# Patient Record
Sex: Female | Born: 1965
Health system: Southern US, Community
[De-identification: ages and names within clinical notes are randomized; demographics above are authoritative.]

## PROBLEM LIST (undated history)

## (undated) DIAGNOSIS — K824 Cholesterolosis of gallbladder: Secondary | ICD-10-CM

## (undated) DIAGNOSIS — E785 Hyperlipidemia, unspecified: Secondary | ICD-10-CM

## (undated) DIAGNOSIS — N390 Urinary tract infection, site not specified: Secondary | ICD-10-CM

## (undated) DIAGNOSIS — Z720 Tobacco use: Secondary | ICD-10-CM

## (undated) DIAGNOSIS — Z794 Long term (current) use of insulin: Secondary | ICD-10-CM

## (undated) DIAGNOSIS — I10 Essential (primary) hypertension: Secondary | ICD-10-CM

## (undated) DIAGNOSIS — F419 Anxiety disorder, unspecified: Secondary | ICD-10-CM

## (undated) DIAGNOSIS — L02421 Furuncle of right axilla: Secondary | ICD-10-CM

## (undated) DIAGNOSIS — G4700 Insomnia, unspecified: Secondary | ICD-10-CM

## (undated) DIAGNOSIS — F329 Major depressive disorder, single episode, unspecified: Secondary | ICD-10-CM

## (undated) DIAGNOSIS — R768 Other specified abnormal immunological findings in serum: Secondary | ICD-10-CM

## (undated) DIAGNOSIS — R319 Hematuria, unspecified: Secondary | ICD-10-CM

## (undated) DIAGNOSIS — I2510 Atherosclerotic heart disease of native coronary artery without angina pectoris: Secondary | ICD-10-CM

## (undated) DIAGNOSIS — F32A Depression, unspecified: Secondary | ICD-10-CM

## (undated) DIAGNOSIS — E109 Type 1 diabetes mellitus without complications: Secondary | ICD-10-CM

## (undated) HISTORY — PX: APPENDECTOMY: SHX54

## (undated) HISTORY — DX: Hematuria, unspecified: R31.9

## (undated) HISTORY — PX: TONSILECTOMY/ADENOIDECTOMY WITH MYRINGOTOMY: SHX6125

## (undated) HISTORY — DX: Cholesterolosis of gallbladder: K82.4

## (undated) HISTORY — DX: Type 1 diabetes mellitus without complications: E10.9

## (undated) HISTORY — DX: Essential (primary) hypertension: I10

## (undated) HISTORY — DX: Atherosclerotic heart disease of native coronary artery without angina pectoris: I25.10

## (undated) HISTORY — DX: Long term (current) use of insulin: Z79.4

## (undated) HISTORY — PX: CARDIAC CATHETERIZATION: SHX172

## (undated) HISTORY — DX: Hyperlipidemia, unspecified: E78.5

## (undated) HISTORY — PX: NASAL SINUS SURGERY: SHX719

## (undated) HISTORY — DX: Anxiety disorder, unspecified: F41.9

## (undated) HISTORY — DX: Other specified abnormal immunological findings in serum: R76.8

## (undated) HISTORY — DX: Furuncle of right axilla: L02.421

## (undated) HISTORY — DX: Urinary tract infection, site not specified: N39.0

## (undated) HISTORY — DX: Major depressive disorder, single episode, unspecified: F32.9

## (undated) HISTORY — DX: Insomnia, unspecified: G47.00

## (undated) HISTORY — DX: Depression, unspecified: F32.A

## (undated) HISTORY — DX: Tobacco use: Z72.0

---

## 2010-05-30 ENCOUNTER — Emergency Department: Payer: Self-pay | Admitting: Emergency Medicine

## 2011-12-07 ENCOUNTER — Emergency Department: Payer: Self-pay | Admitting: Emergency Medicine

## 2012-03-19 ENCOUNTER — Ambulatory Visit: Payer: Self-pay | Admitting: Cardiology

## 2013-09-16 ENCOUNTER — Ambulatory Visit: Payer: Self-pay | Admitting: Surgery

## 2013-09-20 ENCOUNTER — Emergency Department: Payer: Self-pay | Admitting: Emergency Medicine

## 2013-09-25 ENCOUNTER — Ambulatory Visit: Payer: Self-pay | Admitting: Surgery

## 2013-11-13 DIAGNOSIS — R768 Other specified abnormal immunological findings in serum: Secondary | ICD-10-CM

## 2013-11-13 HISTORY — DX: Other specified abnormal immunological findings in serum: R76.8

## 2014-03-23 ENCOUNTER — Ambulatory Visit: Payer: Self-pay | Admitting: General Surgery

## 2014-04-14 ENCOUNTER — Encounter: Payer: Self-pay | Admitting: *Deleted

## 2014-04-14 DIAGNOSIS — K824 Cholesterolosis of gallbladder: Secondary | ICD-10-CM

## 2014-04-14 HISTORY — DX: Cholesterolosis of gallbladder: K82.4

## 2014-05-16 DIAGNOSIS — G4700 Insomnia, unspecified: Secondary | ICD-10-CM | POA: Insufficient documentation

## 2014-05-16 DIAGNOSIS — I2510 Atherosclerotic heart disease of native coronary artery without angina pectoris: Secondary | ICD-10-CM | POA: Insufficient documentation

## 2014-06-08 DIAGNOSIS — Z720 Tobacco use: Secondary | ICD-10-CM | POA: Insufficient documentation

## 2014-06-08 DIAGNOSIS — Z794 Long term (current) use of insulin: Secondary | ICD-10-CM | POA: Insufficient documentation

## 2014-07-16 ENCOUNTER — Other Ambulatory Visit: Payer: Self-pay | Admitting: Family Medicine

## 2014-07-16 DIAGNOSIS — K824 Cholesterolosis of gallbladder: Secondary | ICD-10-CM | POA: Insufficient documentation

## 2014-07-22 ENCOUNTER — Ambulatory Visit: Admission: RE | Admit: 2014-07-22 | Payer: Self-pay | Source: Ambulatory Visit

## 2014-07-27 ENCOUNTER — Ambulatory Visit
Admission: RE | Admit: 2014-07-27 | Discharge: 2014-07-27 | Disposition: A | Discharge status: Home / Self Care | Payer: BLUE CROSS/BLUE SHIELD | Source: Ambulatory Visit | Attending: Family Medicine | Admitting: Family Medicine

## 2014-07-27 ENCOUNTER — Encounter: Payer: Self-pay | Admitting: Family Medicine

## 2014-07-27 DIAGNOSIS — K824 Cholesterolosis of gallbladder: Secondary | ICD-10-CM | POA: Diagnosis not present

## 2014-08-25 ENCOUNTER — Encounter: Payer: Self-pay | Admitting: *Deleted

## 2014-08-25 ENCOUNTER — Inpatient Hospital Stay
Admission: EM | Admit: 2014-08-25 | Discharge: 2014-08-27 | DRG: 395 | Disposition: A | Discharge status: Home / Self Care | Payer: BLUE CROSS/BLUE SHIELD | Attending: Internal Medicine | Admitting: Internal Medicine

## 2014-08-25 ENCOUNTER — Emergency Department: Payer: BLUE CROSS/BLUE SHIELD

## 2014-08-25 DIAGNOSIS — K529 Noninfective gastroenteritis and colitis, unspecified: Secondary | ICD-10-CM | POA: Diagnosis not present

## 2014-08-25 DIAGNOSIS — K922 Gastrointestinal hemorrhage, unspecified: Secondary | ICD-10-CM | POA: Diagnosis present

## 2014-08-25 DIAGNOSIS — G4700 Insomnia, unspecified: Secondary | ICD-10-CM | POA: Diagnosis present

## 2014-08-25 DIAGNOSIS — E10649 Type 1 diabetes mellitus with hypoglycemia without coma: Secondary | ICD-10-CM | POA: Diagnosis present

## 2014-08-25 DIAGNOSIS — Z803 Family history of malignant neoplasm of breast: Secondary | ICD-10-CM | POA: Diagnosis not present

## 2014-08-25 DIAGNOSIS — E785 Hyperlipidemia, unspecified: Secondary | ICD-10-CM | POA: Diagnosis present

## 2014-08-25 DIAGNOSIS — Z882 Allergy status to sulfonamides status: Secondary | ICD-10-CM

## 2014-08-25 DIAGNOSIS — K550 Acute vascular disorders of intestine: Principal | ICD-10-CM | POA: Diagnosis present

## 2014-08-25 DIAGNOSIS — E109 Type 1 diabetes mellitus without complications: Secondary | ICD-10-CM | POA: Diagnosis present

## 2014-08-25 DIAGNOSIS — Z794 Long term (current) use of insulin: Secondary | ICD-10-CM

## 2014-08-25 DIAGNOSIS — I2510 Atherosclerotic heart disease of native coronary artery without angina pectoris: Secondary | ICD-10-CM | POA: Diagnosis present

## 2014-08-25 DIAGNOSIS — Z833 Family history of diabetes mellitus: Secondary | ICD-10-CM

## 2014-08-25 DIAGNOSIS — E11649 Type 2 diabetes mellitus with hypoglycemia without coma: Secondary | ICD-10-CM

## 2014-08-25 DIAGNOSIS — F17210 Nicotine dependence, cigarettes, uncomplicated: Secondary | ICD-10-CM | POA: Diagnosis present

## 2014-08-25 DIAGNOSIS — Z885 Allergy status to narcotic agent status: Secondary | ICD-10-CM

## 2014-08-25 DIAGNOSIS — K219 Gastro-esophageal reflux disease without esophagitis: Secondary | ICD-10-CM | POA: Diagnosis present

## 2014-08-25 DIAGNOSIS — F419 Anxiety disorder, unspecified: Secondary | ICD-10-CM | POA: Diagnosis present

## 2014-08-25 DIAGNOSIS — Z9049 Acquired absence of other specified parts of digestive tract: Secondary | ICD-10-CM | POA: Diagnosis present

## 2014-08-25 DIAGNOSIS — Z9889 Other specified postprocedural states: Secondary | ICD-10-CM | POA: Diagnosis not present

## 2014-08-25 DIAGNOSIS — Z9641 Presence of insulin pump (external) (internal): Secondary | ICD-10-CM | POA: Diagnosis present

## 2014-08-25 DIAGNOSIS — F329 Major depressive disorder, single episode, unspecified: Secondary | ICD-10-CM | POA: Diagnosis present

## 2014-08-25 DIAGNOSIS — Z79899 Other long term (current) drug therapy: Secondary | ICD-10-CM

## 2014-08-25 DIAGNOSIS — Z820 Family history of epilepsy and other diseases of the nervous system: Secondary | ICD-10-CM

## 2014-08-25 DIAGNOSIS — Z823 Family history of stroke: Secondary | ICD-10-CM | POA: Diagnosis not present

## 2014-08-25 DIAGNOSIS — I10 Essential (primary) hypertension: Secondary | ICD-10-CM | POA: Diagnosis present

## 2014-08-25 DIAGNOSIS — Z7982 Long term (current) use of aspirin: Secondary | ICD-10-CM

## 2014-08-25 DIAGNOSIS — Z8249 Family history of ischemic heart disease and other diseases of the circulatory system: Secondary | ICD-10-CM | POA: Diagnosis not present

## 2014-08-25 DIAGNOSIS — B9689 Other specified bacterial agents as the cause of diseases classified elsewhere: Secondary | ICD-10-CM | POA: Diagnosis present

## 2014-08-25 DIAGNOSIS — D72829 Elevated white blood cell count, unspecified: Secondary | ICD-10-CM

## 2014-08-25 DIAGNOSIS — A045 Campylobacter enteritis: Secondary | ICD-10-CM

## 2014-08-25 DIAGNOSIS — Z888 Allergy status to other drugs, medicaments and biological substances status: Secondary | ICD-10-CM | POA: Diagnosis not present

## 2014-08-25 DIAGNOSIS — R109 Unspecified abdominal pain: Secondary | ICD-10-CM

## 2014-08-25 DIAGNOSIS — K55059 Acute (reversible) ischemia of intestine, part and extent unspecified: Secondary | ICD-10-CM

## 2014-08-25 LAB — CBC WITH DIFFERENTIAL/PLATELET
Basophils Absolute: 0 10*3/uL (ref 0–0.1)
Basophils Relative: 0 %
EOS ABS: 0 10*3/uL (ref 0–0.7)
EOS PCT: 0 %
HEMATOCRIT: 43.8 % (ref 35.0–47.0)
Hemoglobin: 14.4 g/dL (ref 12.0–16.0)
Lymphocytes Relative: 9 %
Lymphs Abs: 1.6 10*3/uL (ref 1.0–3.6)
MCH: 29 pg (ref 26.0–34.0)
MCHC: 32.8 g/dL (ref 32.0–36.0)
MCV: 88.3 fL (ref 80.0–100.0)
MONOS PCT: 4 %
Monocytes Absolute: 0.8 10*3/uL (ref 0.2–0.9)
Neutro Abs: 16 10*3/uL — ABNORMAL HIGH (ref 1.4–6.5)
Neutrophils Relative %: 87 %
Platelets: 216 10*3/uL (ref 150–440)
RBC: 4.96 MIL/uL (ref 3.80–5.20)
RDW: 14.5 % (ref 11.5–14.5)
WBC: 18.5 10*3/uL — AB (ref 3.6–11.0)

## 2014-08-25 LAB — COMPREHENSIVE METABOLIC PANEL
ALBUMIN: 4 g/dL (ref 3.5–5.0)
ALT: 21 U/L (ref 14–54)
ANION GAP: 9 (ref 5–15)
AST: 23 U/L (ref 15–41)
Alkaline Phosphatase: 76 U/L (ref 38–126)
BUN: 10 mg/dL (ref 6–20)
CO2: 22 mmol/L (ref 22–32)
CREATININE: 0.69 mg/dL (ref 0.44–1.00)
Calcium: 9.2 mg/dL (ref 8.9–10.3)
Chloride: 105 mmol/L (ref 101–111)
GFR calc Af Amer: >60 mL/min (ref >60)
GFR calc non Af Amer: >60 mL/min (ref >60)
Glucose, Bld: 169 mg/dL — ABNORMAL HIGH (ref 65–99)
Potassium: 4 mmol/L (ref 3.5–5.1)
Sodium: 136 mmol/L (ref 135–145)
Total Bilirubin: 0.3 mg/dL (ref 0.3–1.2)
Total Protein: 7.4 g/dL (ref 6.5–8.1)

## 2014-08-25 LAB — PROTIME-INR
INR: 0.93
Prothrombin Time: 12.7 seconds (ref 11.4–15.0)

## 2014-08-25 LAB — URINALYSIS COMPLETE WITH MICROSCOPIC (ARMC ONLY)
BACTERIA UA: NONE SEEN
Bilirubin Urine: NEGATIVE
Glucose, UA: NEGATIVE mg/dL
Leukocytes, UA: NEGATIVE
Nitrite: NEGATIVE
PROTEIN: NEGATIVE mg/dL
Specific Gravity, Urine: 1.02 (ref 1.005–1.030)
pH: 5 (ref 5.0–8.0)

## 2014-08-25 LAB — LIPASE, BLOOD: Lipase: 54 U/L — ABNORMAL HIGH (ref 22–51)

## 2014-08-25 MED ORDER — IOHEXOL 240 MG/ML SOLN
25.0000 mL | Freq: Once | INTRAMUSCULAR | Status: AC | PRN
  Administered 2014-08-25: 25 mL via ORAL

## 2014-08-25 MED ORDER — LISINOPRIL 10 MG PO TABS
10.0000 mg | ORAL_TABLET | Freq: Every day | ORAL | Status: DC
  Administered 2014-08-26 – 2014-08-27 (×2): 10 mg via ORAL
  Filled 2014-08-25 (×2): qty 1

## 2014-08-25 MED ORDER — ACETAMINOPHEN 650 MG RE SUPP
650.0000 mg | Freq: Four times a day (QID) | RECTAL | Status: DC | PRN
Start: 2014-08-25 — End: 2014-08-27

## 2014-08-25 MED ORDER — PROMETHAZINE HCL 25 MG/ML IJ SOLN
25.0000 mg | Freq: Once | INTRAMUSCULAR | Status: AC
  Administered 2014-08-25: 25 mg via INTRAVENOUS
  Filled 2014-08-25: qty 1

## 2014-08-25 MED ORDER — METOPROLOL SUCCINATE ER 25 MG PO TB24
25.0000 mg | ORAL_TABLET | Freq: Every day | ORAL | Status: DC
  Administered 2014-08-26 – 2014-08-27 (×2): 25 mg via ORAL
  Filled 2014-08-25 (×2): qty 1

## 2014-08-25 MED ORDER — ROSUVASTATIN CALCIUM 10 MG PO TABS
10.0000 mg | ORAL_TABLET | Freq: Every day | ORAL | Status: DC
  Administered 2014-08-26 – 2014-08-27 (×2): 10 mg via ORAL
  Filled 2014-08-25 (×2): qty 1

## 2014-08-25 MED ORDER — ACETAMINOPHEN 325 MG PO TABS: 650.0000 mg | ORAL_TABLET | Freq: Four times a day (QID) | ORAL | Status: DC | PRN

## 2014-08-25 MED ORDER — CIPROFLOXACIN IN D5W 400 MG/200ML IV SOLN
400.0000 mg | Freq: Once | INTRAVENOUS | Status: AC
  Administered 2014-08-25: 400 mg via INTRAVENOUS
  Filled 2014-08-25: qty 200

## 2014-08-25 MED ORDER — CIPROFLOXACIN IN D5W 400 MG/200ML IV SOLN
400.0000 mg | Freq: Two times a day (BID) | INTRAVENOUS | Status: DC
  Administered 2014-08-26 – 2014-08-27 (×3): 400 mg via INTRAVENOUS
  Filled 2014-08-25 (×5): qty 200

## 2014-08-25 MED ORDER — ASPIRIN EC 81 MG PO TBEC
81.0000 mg | DELAYED_RELEASE_TABLET | Freq: Every day | ORAL | Status: DC
  Filled 2014-08-25: qty 1

## 2014-08-25 MED ORDER — INSULIN ASPART 100 UNIT/ML ~~LOC~~ SOLN: 0.0000 [IU] | Freq: Four times a day (QID) | SUBCUTANEOUS | Status: DC

## 2014-08-25 MED ORDER — ONDANSETRON HCL 4 MG PO TABS: 4.0000 mg | ORAL_TABLET | Freq: Four times a day (QID) | ORAL | Status: DC | PRN

## 2014-08-25 MED ORDER — IOHEXOL 350 MG/ML SOLN
100.0000 mL | Freq: Once | INTRAVENOUS | Status: AC | PRN
  Administered 2014-08-25: 100 mL via INTRAVENOUS

## 2014-08-25 MED ORDER — METRONIDAZOLE IN NACL 5-0.79 MG/ML-% IV SOLN
500.0000 mg | Freq: Once | INTRAVENOUS | Status: AC
  Administered 2014-08-25: 500 mg via INTRAVENOUS
  Filled 2014-08-25: qty 100

## 2014-08-25 MED ORDER — NICOTINE 10 MG IN INHA
1.0000 | RESPIRATORY_TRACT | Status: DC | PRN
  Filled 2014-08-25: qty 36

## 2014-08-25 MED ORDER — BUSPIRONE HCL 10 MG PO TABS
15.0000 mg | ORAL_TABLET | Freq: Three times a day (TID) | ORAL | Status: DC
  Filled 2014-08-25: qty 2
  Filled 2014-08-25 (×2): qty 1
  Filled 2014-08-25: qty 2

## 2014-08-25 MED ORDER — METRONIDAZOLE IN NACL 5-0.79 MG/ML-% IV SOLN
500.0000 mg | Freq: Three times a day (TID) | INTRAVENOUS | Status: DC
  Administered 2014-08-26 – 2014-08-27 (×5): 500 mg via INTRAVENOUS
  Filled 2014-08-25 (×8): qty 100

## 2014-08-25 MED ORDER — PANTOPRAZOLE SODIUM 40 MG IV SOLR
40.0000 mg | Freq: Two times a day (BID) | INTRAVENOUS | Status: DC
  Administered 2014-08-25 – 2014-08-27 (×4): 40 mg via INTRAVENOUS
  Filled 2014-08-25 (×4): qty 40

## 2014-08-25 MED ORDER — SODIUM CHLORIDE 0.9 % IV BOLUS (SEPSIS)
1000.0000 mL | Freq: Once | INTRAVENOUS | Status: AC
  Administered 2014-08-25: 1000 mL via INTRAVENOUS

## 2014-08-25 MED ORDER — ONDANSETRON HCL 4 MG/2ML IJ SOLN
4.0000 mg | Freq: Four times a day (QID) | INTRAMUSCULAR | Status: DC | PRN
  Administered 2014-08-25: 4 mg via INTRAVENOUS
  Filled 2014-08-25: qty 2

## 2014-08-25 MED ORDER — SODIUM CHLORIDE 0.9 % IV SOLN
Freq: Once | INTRAVENOUS | Status: AC
  Administered 2014-08-25: 21:00:00 via INTRAVENOUS

## 2014-08-25 MED ORDER — CLONAZEPAM 0.5 MG PO TABS: 0.5000 mg | ORAL_TABLET | Freq: Every day | ORAL | Status: DC | PRN

## 2014-08-25 NOTE — H&P (Signed)
[facility] [place] [facility]   PATIENT NAME: Brittany Zavala    MR#:  [id number]  DATE OF BIRTH:  [birth year]  DATE OF ADMISSION:  08/25/2014  PRIMARY CARE PHYSICIAN: Vonita Moss, MD   REQUESTING/REFERRING PHYSICIAN: Paduchowski  CHIEF COMPLAINT:   Chief Complaint  Patient presents with  . Diarrhea    HISTORY OF PRESENT ILLNESS:  Brittany Zavala  is a 49 y.o. female who presents with 1 day of abdominal pain, nausea, diarrhea, and bleeding per rectum. She states that she started having abdominal pain and chills around lunchtime today, and then she started developing diarrhea which turned bloody bowel movements. She's had about 4 episodes of bleeding per rectum, with the last visit seem like a straight blood. On evaluation in the ED she is hemodynamically stable, with stable hemoglobin. CT abdomen and pelvis was negative, but she does have an elevated white blood cell count. She was covered with Cipro and Flagyl for possible colitis, and hospitalists were called for admission for GI bleed. Of note, patient is a type I diabetic and uses an insulin pump at home.  PAST MEDICAL HISTORY:   Past Medical History  Diagnosis Date  . Hyperlipidemia   . Hypertension   . Depression   . Anxiety   . Insomnia   . CAD (coronary artery disease)   . Chronic UTI   . Gallbladder polyp March 2016    needs f/u scan in Sept 2016  . Type 1 diabetes mellitus   . Hematuria   . Insomnia   . Tobacco abuse   . Gall bladder polyp 3/16    On UA- needs follow up scan 6 months later, (9/16)  . Furuncle of right axilla   . Current use of insulin   . ANA positive Oct 2015    1:160 speckled    PAST SURGICAL HISTORY:   Past Surgical History  Procedure Laterality Date  . Cesarean section    . Tonsilectomy/adenoidectomy with myringotomy    . Nasal sinus surgery    . Appendectomy    . Cesarean section      x2    SOCIAL HISTORY:   History  Substance Use Topics   . Smoking status: Current Every Day Smoker    Types: Cigarettes  . Smokeless tobacco: Never Used  . Alcohol Use: No    FAMILY HISTORY:   Family History  Problem Relation Age of Onset  . Cancer Mother     breast  . Stroke Mother   . Dementia Mother   . Addison's disease Mother   . Diabetes Mother   . Heart disease Mother   . Heart disease Father   . Cancer Sister     breast    DRUG ALLERGIES:   Allergies  Allergen Reactions  . Morphine And Related   . Sulfa Antibiotics Hives  . Vicodin [Hydrocodone-Acetaminophen] Nausea And Vomiting  . Septra [Sulfamethoxazole-Trimethoprim] Rash    MEDICATIONS AT HOME:   Prior to Admission medications   Medication Sig Start Date End Date Taking? Authorizing Provider  aspirin EC 81 MG tablet Take 81 mg by mouth daily.    Historical Provider, MD  BD PEN NEEDLE NANO U/F 32G X 4 MM MISC  03/06/14   Historical Provider, MD  busPIRone (BUSPAR) 15 MG tablet Take 15 mg by mouth 3 (three) times daily.    Historical Provider, MD  clonazePAM (KLONOPIN) 0.5 MG tablet Take 0.5 mg by mouth daily as needed for anxiety.  Kerman PasseyMelinda P Lada, MD  CRESTOR 10 MG tablet Take 10 mg by mouth daily.  03/06/14   Historical Provider, MD  fluconazole (DIFLUCAN) 150 MG tablet Take 150 mg by mouth daily as needed.    Kerman PasseyMelinda P Lada, MD  glucagon 1 MG injection Inject 1 mg into the vein once as needed.    Historical Provider, MD  HUMALOG KWIKPEN 100 UNIT/ML KiwkPen Inject 10-20 Units into the skin 3 (three) times daily.  03/06/14   Kerman PasseyMelinda P Lada, MD  hydrOXYzine (VISTARIL) 25 MG capsule Take 25 mg by mouth at bedtime as needed.    Kerman PasseyMelinda P Lada, MD  LANTUS SOLOSTAR 100 UNIT/ML Solostar Pen Inject 60 Units into the skin daily at 10 pm.  03/06/14   Kerman PasseyMelinda P Lada, MD  metoprolol succinate (TOPROL-XL) 25 MG 24 hr tablet Take 25 mg by mouth daily.  03/06/14   Kerman PasseyMelinda P Lada, MD  nicotine (NICOTROL) 10 MG inhaler Inhale 1 continuous puffing into the lungs as needed for  smoking cessation.    Historical Provider, MD  omeprazole (PRILOSEC) 20 MG capsule Take 20 mg by mouth daily.  03/06/14   Kerman PasseyMelinda P Lada, MD  ONE TOUCH ULTRA TEST test strip  03/06/14   Historical Provider, MD  quinapril (ACCUPRIL) 10 MG tablet Take 10 mg by mouth daily.  03/05/14   Kerman PasseyMelinda P Lada, MD    REVIEW OF SYSTEMS:  Review of Systems  Constitutional: Positive for chills. Negative for fever, weight loss and malaise/fatigue.  HENT: Negative for ear pain, hearing loss and tinnitus.   Eyes: Negative for blurred vision, double vision, pain and redness.  Respiratory: Negative for cough, hemoptysis and shortness of breath.   Cardiovascular: Negative for chest pain, palpitations, orthopnea and leg swelling.  Gastrointestinal: Positive for nausea, abdominal pain, diarrhea and blood in stool. Negative for vomiting and constipation.  Genitourinary: Negative for dysuria, frequency and hematuria.  Musculoskeletal: Negative for back pain, joint pain and neck pain.  Skin:       No acne, rash, or lesions  Neurological: Negative for dizziness, tremors, focal weakness and weakness.  Endo/Heme/Allergies: Negative for polydipsia. Does not bruise/bleed easily.  Psychiatric/Behavioral: Negative for depression. The patient is not nervous/anxious and does not have insomnia.      VITAL SIGNS:   Filed Vitals:   08/25/14 1930 08/25/14 2003 08/25/14 2030 08/25/14 2100  BP: 165/78 177/84 166/83 161/78  Pulse: 65 90    Temp:      Resp:  18    Height:      Weight:      SpO2: 100%      Wt Readings from Last 3 Encounters:  08/25/14 104.327 kg (230 lb)  06/05/14 103.874 kg (229 lb)  05/15/14 102.967 kg (227 lb)    PHYSICAL EXAMINATION:  Physical Exam  Constitutional: She is oriented to person, place, and time. She appears well-developed and well-nourished. No distress.  HENT:  Head: Normocephalic and atraumatic.  Mouth/Throat: Oropharynx is clear and moist.  Eyes: Conjunctivae and EOM are normal.  Pupils are equal, round, and reactive to light. No scleral icterus.  Neck: Normal range of motion. Neck supple. No JVD present. No thyromegaly present.  Cardiovascular: Normal rate, regular rhythm and intact distal pulses.  Exam reveals no gallop and no friction rub.   No murmur heard. Respiratory: Effort normal and breath sounds normal. No respiratory distress. She has no wheezes. She has no rales.  GI: Soft. Bowel sounds are normal. She exhibits no distension. There  is tenderness (Lower abdominal, greatest in the left lower quadrant).  Musculoskeletal: Normal range of motion. She exhibits no edema.  No arthritis, no gout  Lymphadenopathy:    She has no cervical adenopathy.  Neurological: She is alert and oriented to person, place, and time. No cranial nerve deficit.  No dysarthria, no aphasia  Skin: Skin is warm and dry. No rash noted. No erythema.  Psychiatric: She has a normal mood and affect. Her behavior is normal. Judgment and thought content normal.    LABORATORY PANEL:   CBC  Recent Labs Lab 08/25/14 1700  WBC 18.5*  HGB 14.4  HCT 43.8  PLT 216   ------------------------------------------------------------------------------------------------------------------  Chemistries   Recent Labs Lab 08/25/14 1700  NA 136  K 4.0  CL 105  CO2 22  GLUCOSE 169*  BUN 10  CREATININE 0.69  CALCIUM 9.2  AST 23  ALT 21  ALKPHOS 76  BILITOT 0.3   ------------------------------------------------------------------------------------------------------------------  Cardiac Enzymes No results for input(s): TROPONINI in the last 168 hours. ------------------------------------------------------------------------------------------------------------------  RADIOLOGY:  Ct Abdomen Pelvis W Contrast  08/25/2014   CLINICAL DATA:  Diffuse abdominal pain and blood in stool.  EXAM: CT ABDOMEN AND PELVIS WITH CONTRAST  TECHNIQUE: Multidetector CT imaging of the abdomen and pelvis was  performed using the standard protocol following bolus administration of intravenous contrast.  CONTRAST:  OMNIPAQUE IOHEXOL 350 MG/ML SOLN  COMPARISON:  None.  FINDINGS: Small to moderate-sized hiatal hernia present. The liver, gallbladder, pancreas, spleen, adrenal glands and kidneys are within normal limits.  Bowel shows no evidence of obstruction or inflammation. No focal bowel lesion is identified. There is a focal area of bulky mesenteric calcification in the left lower quadrant measuring approximately 11 mm in diameter lying adjacent to small bowel. This is not causing bowel obstruction and is not associated with a soft tissue mass or surrounding inflammation.  No abnormal fluid collection or abscess is seen. No lymphadenopathy identified. No hernias are seen. The bladder is unremarkable. The uterus appears unremarkable by CT. 1.8 cm cyst of the left ovary is likely physiologic. No bony abnormalities are identified. Calcified plaque is present in the distal aorta bilateral common iliac arteries without evidence of aneurysm. Visualized lung bases are unremarkable.  IMPRESSION: 1. No evidence of bowel lesion or inflammation. 2. Bulky left lower quadrant mesenteric calcification which is likely benign. This abuts small bowel but is not causing any inflammation or obstruction.   Electronically Signed   By: Irish Lack M.D.   On: 08/25/2014 20:04    EKG:  No orders found for this or any previous visit.  IMPRESSION AND PLAN:  Principal Problem:   GI bleed - unclear etiology of this time, seems potentially be a lower origin GI bleed. CT scan of the abdomen and pelvis was negative, surprisingly. Given her elevated white count we'll continue to cover her for colitis, we'll put her on twice a day PPI, continue monitor hemoglobin, and get a GI consult. We will keep her nothing by mouth for now Active Problems:   HTN (hypertension) - stable and even moderately elevated, continue home antihypertensives,  with IV when necessary antihypertensives.   GERD (gastroesophageal reflux disease) - PPI as above   Type 1 diabetes - sliding scale insulin with appropriate every 6 hour fingersticks while nothing by mouth   Anxiety - continue home anxiolytic   HLD (hyperlipidemia) - continue home statin when taking by mouth  All the records are reviewed and case discussed with ED provider.  Management plans discussed with the patient and/or family.  DVT PROPHYLAXIS: mechanical only  ADMISSION STATUS: Inpatient  CODE STATUS: Full  TOTAL TIME TAKING CARE OF THIS PATIENT: 45 minutes.    Gerilyn Stargell FIELDING 08/25/2014, 9:54 PM  [facility]  Office  [phone]  CC: Primary care physician; Vonita Moss, MD

## 2014-08-25 NOTE — ED Provider Notes (Signed)
[facility] Emergency Department Provider Note  Time seen: 6:53 PM  I have reviewed the triage vital signs and the nursing notes.   HISTORY  Chief Complaint Diarrhea    HPI Brittany Zavala is a 49 y.o. female with a past medical history of hypertension, hyperlipidemia, type I diabetic who presents the emergency department with lower abdominal pain and bloody diarrhea. According to the patient around noon today she started feeling lower abdominal cramping type pains. She had several bowel movements which turned watery, and then to blood. She has had approximately 4 bright red blood bowel movements this afternoon/evening. States her lower abdominal pain is left and right lower abdomen, also somewhat in the left upper quadrant as well. Denies any dysuria, has noted darker urine lately. Denies any other bleeding. Patient describes her abdominal cramping as moderate, diarrhea as moderate, has not noticed any modifying factors. Patient does feel nauseated, but has not vomited.     Past Medical History  Diagnosis Date  . Hyperlipidemia   . Hypertension   . Depression   . Anxiety   . Insomnia   . CAD (coronary artery disease)   . Chronic UTI   . Gallbladder polyp March 2016    needs f/u scan in Sept 2016  . Type 1 diabetes mellitus   . Hematuria   . Insomnia   . Tobacco abuse   . Gall bladder polyp 3/16    On UA- needs follow up scan 6 months later, (9/16)  . Furuncle of right axilla   . Current use of insulin   . ANA positive Oct 2015    1:160 speckled    Patient Active Problem List   Diagnosis Date Noted  . Gallbladder polyp 07/16/2014  . Tobacco abuse   . Current use of insulin   . Insomnia   . CAD (coronary artery disease)   . Gallbladder polyp 04/14/2014  . ANA positive 11/13/2013    Past Surgical History  Procedure Laterality Date  . Cesarean section    . Tonsilectomy/adenoidectomy with myringotomy    . Nasal sinus surgery     . Appendectomy    . Cesarean section      x2    Current Outpatient Rx  Name  Route  Sig  Dispense  Refill  . aspirin EC 81 MG tablet   Oral   Take 81 mg by mouth daily.         . BD PEN NEEDLE NANO U/F 32G X 4 MM MISC                 Dispense as written.   . busPIRone (BUSPAR) 15 MG tablet   Oral   Take 15 mg by mouth 3 (three) times daily.         . clonazePAM (KLONOPIN) 0.5 MG tablet   Oral   Take 0.5 mg by mouth daily as needed for anxiety.         . CRESTOR 10 MG tablet   Oral   Take 10 mg by mouth daily.            Dispense as written.   . fluconazole (DIFLUCAN) 150 MG tablet   Oral   Take 150 mg by mouth daily as needed.         Marland Kitchen glucagon 1 MG injection   Intravenous   Inject 1 mg into the vein once as needed.         Marland Kitchen HUMALOG KWIKPEN 100  UNIT/ML KiwkPen   Subcutaneous   Inject 10-20 Units into the skin 3 (three) times daily.            Dispense as written.   . hydrOXYzine (VISTARIL) 25 MG capsule   Oral   Take 25 mg by mouth at bedtime as needed.         Marland Kitchen LANTUS SOLOSTAR 100 UNIT/ML Solostar Pen   Subcutaneous   Inject 60 Units into the skin daily at 10 pm.            Dispense as written.   . metoprolol succinate (TOPROL-XL) 25 MG 24 hr tablet   Oral   Take 25 mg by mouth daily.          . nicotine (NICOTROL) 10 MG inhaler   Inhalation   Inhale 1 continuous puffing into the lungs as needed for smoking cessation.         Marland Kitchen omeprazole (PRILOSEC) 20 MG capsule   Oral   Take 20 mg by mouth daily.          . ONE TOUCH ULTRA TEST test strip                 Dispense as written.   . quinapril (ACCUPRIL) 10 MG tablet   Oral   Take 10 mg by mouth daily.            Allergies Morphine and related; Vicodin; and Septra  Family History  Problem Relation Age of Onset  . Cancer Mother     breast  . Stroke Mother   . Dementia Mother   . Addison's disease Mother   . Diabetes Mother   . Heart disease  Mother   . Heart disease Father   . Cancer Sister     breast    Social History History  Substance Use Topics  . Smoking status: Current Every Day Smoker    Types: Cigarettes  . Smokeless tobacco: Never Used  . Alcohol Use: No    Review of Systems Constitutional: Negative for fever. Cardiovascular: Negative for chest pain. Respiratory: Negative for shortness of breath. Gastrointestinal: Positive for lower and left upper quadrant abdominal pain. Positive for nausea. Negative for vomiting. Positive for bloody diarrhea. Genitourinary: Negative for dysuria. Positive for darker urine. Musculoskeletal: Negative for back pain. 10-point ROS otherwise negative.  ____________________________________________   PHYSICAL EXAM:  VITAL SIGNS: ED Triage Vitals  Enc Vitals Group     BP 08/25/14 1652 127/75 mmHg     Pulse Rate 08/25/14 1652 79     Resp 08/25/14 1652 24     Temp 08/25/14 1652 98.4 F (36.9 C)     Temp src --      SpO2 08/25/14 1652 98 %     Weight 08/25/14 1652 230 lb (104.327 kg)     Height 08/25/14 1652 5\' 8"  (1.727 m)     Head Cir --      Peak Flow --      Pain Score 08/25/14 1652 5     Pain Loc --      Pain Edu? --      Excl. in GC? --     Constitutional: Alert and oriented. Mild distress due to pain. Eyes: Normal exam ENT   Mouth/Throat: Mucous membranes are moist. Cardiovascular: Normal rate, regular rhythm. No murmur Respiratory: Normal respiratory effort without tachypnea nor retractions. Breath sounds are clear and equal bilaterally. No wheezes/rales/rhonchi. Gastrointestinal: Soft, moderate right and left lower abdominal tenderness palpation,  mild left upper quadrant tenderness palpation. No right upper quadrant tenderness palpation. No rebound or guarding. No distention. Musculoskeletal: Nontender with normal range of motion in all extremities.  Neurologic:  Normal speech and language. No gross focal neurologic deficits  Skin:  Skin is warm, dry  and intact.  Psychiatric: Mood and affect are normal. Speech and behavior are normal.  ____________________________________________   RADIOLOGY  CT abdomen and pelvis: No acute abnormalities.  ____________________________________________   INITIAL IMPRESSION / ASSESSMENT AND PLAN / ED COURSE  Pertinent labs & imaging results that were available during my care of the patient were reviewed by me and considered in my medical decision making (see chart for details).  Patient with somewhat diffuse abdominal pain/cramping, now with bloody diarrhea 4 today. Patient's labs have resulted showing an elevated white blood cell count of 18.5. Labs and history most suggestive of a colitis. Her son has a history of colitis and has had several bowel operations. She has no personal history of colitis. Denies any personal history of colitis or diverticulitis, has never had bleeding per rectum previously. Denies any rectal pain. We will obtain a CT scan of her abdomen and pelvis. We will treat with Phenergan and fluids. Patient wishes to hold off on IV pain medication at this time.  CT scan has resulted in largely normal results. Rectal exam shows gross blood, strongly guaiac positive. Given likely lower GI bleed with a white count of 18, and abdominal pain we will cover colitis even with a negative CT scan. We'll admit the patient to the hospital for further evaluation and treatment.  ____________________________________________   FINAL CLINICAL IMPRESSION(S) / ED DIAGNOSES  Lower abdominal pain Lower GI bleed   Minna AntisKevin Ibrohim Simmers, MD 08/25/14 2116

## 2014-08-25 NOTE — ED Notes (Signed)
AAOx3.  Anxious.  Skin warm and dry.  No SOB/DOE.  C/o abdominal pain 4/10.  Ambulated to BR independently. Gait steady, posture relaxed. Continue to monitor.

## 2014-08-25 NOTE — ED Notes (Signed)
Today developed diarrhea with some bright red blood in it, abd cramps, left flank

## 2014-08-26 LAB — BASIC METABOLIC PANEL
ANION GAP: 9 (ref 5–15)
BUN: 6 mg/dL (ref 6–20)
CO2: 22 mmol/L (ref 22–32)
CREATININE: 0.57 mg/dL (ref 0.44–1.00)
Calcium: 8.6 mg/dL — ABNORMAL LOW (ref 8.9–10.3)
Chloride: 107 mmol/L (ref 101–111)
GFR calc Af Amer: >60 mL/min (ref >60)
GFR calc non Af Amer: >60 mL/min (ref >60)
GLUCOSE: 185 mg/dL — AB (ref 65–99)
Potassium: 3.7 mmol/L (ref 3.5–5.1)
Sodium: 138 mmol/L (ref 135–145)

## 2014-08-26 LAB — CBC
HEMATOCRIT: 38.7 % (ref 35.0–47.0)
HEMOGLOBIN: 12.6 g/dL (ref 12.0–16.0)
MCH: 28.8 pg (ref 26.0–34.0)
MCHC: 32.4 g/dL (ref 32.0–36.0)
MCV: 88.8 fL (ref 80.0–100.0)
PLATELETS: 193 10*3/uL (ref 150–440)
RBC: 4.36 MIL/uL (ref 3.80–5.20)
RDW: 14.4 % (ref 11.5–14.5)
WBC: 13.1 10*3/uL — ABNORMAL HIGH (ref 3.6–11.0)

## 2014-08-26 LAB — GLUCOSE, CAPILLARY
Glucose-Capillary: 101 mg/dL — ABNORMAL HIGH (ref 65–99)
Glucose-Capillary: 172 mg/dL — ABNORMAL HIGH (ref 65–99)
Glucose-Capillary: 178 mg/dL — ABNORMAL HIGH (ref 65–99)
Glucose-Capillary: 185 mg/dL — ABNORMAL HIGH (ref 65–99)
Glucose-Capillary: 210 mg/dL — ABNORMAL HIGH (ref 65–99)

## 2014-08-26 LAB — HEMOGLOBIN A1C: Hgb A1c MFr Bld: 7.1 % — ABNORMAL HIGH (ref 4.0–6.0)

## 2014-08-26 LAB — C DIFFICILE QUICK SCREEN W PCR REFLEX
C Diff antigen: NEGATIVE
C Diff interpretation: NEGATIVE
C Diff toxin: NEGATIVE

## 2014-08-26 MED ORDER — POTASSIUM CHLORIDE IN NACL 20-0.9 MEQ/L-% IV SOLN
INTRAVENOUS | Status: DC
  Administered 2014-08-26 – 2014-08-27 (×2): via INTRAVENOUS
  Filled 2014-08-26 (×7): qty 1000

## 2014-08-26 MED ORDER — TRAMADOL HCL 50 MG PO TABS
50.0000 mg | ORAL_TABLET | Freq: Four times a day (QID) | ORAL | Status: DC | PRN
  Administered 2014-08-26 (×2): 50 mg via ORAL
  Filled 2014-08-26 (×2): qty 1

## 2014-08-26 NOTE — Progress Notes (Signed)
Patient states that she is pain.Having abdominal  aching pain. Patient refuses to take PRN pain medication. MD paged awaiting response.

## 2014-08-26 NOTE — Progress Notes (Signed)
Dr. Clent RidgesWalsh returned paged no new orders giving. Nursing will continue to monitor.

## 2014-08-26 NOTE — Progress Notes (Signed)
Patient is alert and oriented. Reporting pain improved to tolerable level with PRN. Up to BR for red, scant BM today x 2. Voiding independently. GI consult ordered. Diet advanced to clear liquids per prime MD. Episode of patient's blood glucose trending downward per patient managed insulin pump. Blood glucose improved after PO juice. Resting quietly between care. Will continue to monitor.

## 2014-08-26 NOTE — Progress Notes (Signed)
[facility] [place] [facility]   PATIENT NAME: Brittany Zavala    MR#:  [id number]  DATE OF BIRTH:  October 01, 1965  SUBJECTIVE:  CHIEF COMPLAINT:   Chief Complaint  Patient presents with  . Diarrhea   the patient presented to the hospital with nausea ,  diarrhea and rectal bleeding as well as chills. In emergency room, she was noted to have marked leukocytosis with white blood cell count 18,000 and a CT scan of the abdomen and pelvis was performed. CT scan revealed left lower quadrant mesenteric calcification which was felt to be benign but otherwise no lesions or inflammation or obstruction. Patient was initiated on ciprofloxacin and Flagyl IV. She denies any more bleeding, although per nursing staff she did have small amount of blood rectally earlier today. White blood cell count is improving with current therapy and hemoglobin remains stable, although slightly lower than on admission. Blood Glucose levels are lower, patient is nothing by mouth so far. Complains of lower abdominal pain and cramping sensation just prior to defecation. Complains of headache and requests nonsteroidal anti-inflammatory medications which she takes intermittently. Refuses Tylenol due to history of problems with liver is as well as interference with "blood glucose readings":  Review of Systems  Constitutional: Negative for fever, chills and weight loss.  HENT: Negative for congestion.   Eyes: Negative for blurred vision and double vision.  Respiratory: Negative for cough, sputum production, shortness of breath and wheezing.   Cardiovascular: Negative for chest pain, palpitations, orthopnea, leg swelling and PND.  Gastrointestinal: Positive for nausea, abdominal pain, diarrhea and blood in stool. Negative for vomiting and constipation.  Genitourinary: Negative for dysuria, urgency, frequency and hematuria.  Musculoskeletal: Negative for falls.  Neurological: Negative for dizziness,  tremors, focal weakness and headaches.  Endo/Heme/Allergies: Does not bruise/bleed easily.  Psychiatric/Behavioral: Negative for depression. The patient does not have insomnia.     VITAL SIGNS: Blood pressure 152/75, pulse 96, temperature 98 F (36.7 C), temperature source Oral, resp. rate 16, height 5\' 8"  (1.727 m), weight 102.377 kg (225 lb 11.2 oz), last menstrual period 08/20/2014, SpO2 99 %.  PHYSICAL EXAMINATION:   GENERAL:  49 y.o.-year-old patient lying in the bed with no acute distress. Dry oral mucosa EYES: Pupils equal, round, reactive to light and accommodation. No scleral icterus. Extraocular muscles intact.  HEENT: Head atraumatic, normocephalic. Oropharynx and nasopharynx clear.  NECK:  Supple, no jugular venous distention. No thyroid enlargement, no tenderness.  LUNGS: Normal breath sounds bilaterally, no wheezing, rales,rhonchi or crepitation. No use of accessory muscles of respiration.  CARDIOVASCULAR: S1, S2 normal. No murmurs, rubs, or gallops.  ABDOMEN: Soft, tender in lower abdomen suprapubically. There is no rebound or guarding, nondistended. Bowel sounds present. No organomegaly or mass.  EXTREMITIES: No pedal edema, cyanosis, or clubbing.  NEUROLOGIC: Cranial nerves II through XII are intact. Muscle strength 5/5 in all extremities. Sensation intact. Gait not checked.  PSYCHIATRIC: The patient is alert and oriented x 3.  SKIN: No obvious rash, lesion, or ulcer.   ORDERS/RESULTS REVIEWED:   CBC  Recent Labs Lab 08/25/14 1700 08/26/14 0525  WBC 18.5* 13.1*  HGB 14.4 12.6  HCT 43.8 38.7  PLT 216 193  MCV 88.3 88.8  MCH 29.0 28.8  MCHC 32.8 32.4  RDW 14.5 14.4  LYMPHSABS 1.6  --   MONOABS 0.8  --   EOSABS 0.0  --   BASOSABS 0.0  --    ------------------------------------------------------------------------------------------------------------------  Chemistries   Recent Labs Lab  08/25/14 1700 08/26/14 0525  NA 136 138  K 4.0 3.7  CL 105 107   CO2 22 22  GLUCOSE 169* 185*  BUN 10 6  CREATININE 0.69 0.57  CALCIUM 9.2 8.6*  AST 23  --   ALT 21  --   ALKPHOS 76  --   BILITOT 0.3  --    ------------------------------------------------------------------------------------------------------------------ estimated creatinine clearance is 108.8 mL/min (by C-G formula based on Cr of 0.57). ------------------------------------------------------------------------------------------------------------------ No results for input(s): TSH, T4TOTAL, T3FREE, THYROIDAB in the last 72 hours.  Invalid input(s): FREET3  Cardiac Enzymes No results for input(s): CKMB, TROPONINI, MYOGLOBIN in the last 168 hours.  Invalid input(s): CK ------------------------------------------------------------------------------------------------------------------ Invalid input(s): POCBNP ---------------------------------------------------------------------------------------------------------------  RADIOLOGY: Ct Abdomen Pelvis W Contrast  08/25/2014   CLINICAL DATA:  Diffuse abdominal pain and blood in stool.  EXAM: CT ABDOMEN AND PELVIS WITH CONTRAST  TECHNIQUE: Multidetector CT imaging of the abdomen and pelvis was performed using the standard protocol following bolus administration of intravenous contrast.  CONTRAST:  OMNIPAQUE IOHEXOL 350 MG/ML SOLN  COMPARISON:  None.  FINDINGS: Small to moderate-sized hiatal hernia present. The liver, gallbladder, pancreas, spleen, adrenal glands and kidneys are within normal limits.  Bowel shows no evidence of obstruction or inflammation. No focal bowel lesion is identified. There is a focal area of bulky mesenteric calcification in the left lower quadrant measuring approximately 11 mm in diameter lying adjacent to small bowel. This is not causing bowel obstruction and is not associated with a soft tissue mass or surrounding inflammation.  No abnormal fluid collection or abscess is seen. No lymphadenopathy identified. No  hernias are seen. The bladder is unremarkable. The uterus appears unremarkable by CT. 1.8 cm cyst of the left ovary is likely physiologic. No bony abnormalities are identified. Calcified plaque is present in the distal aorta bilateral common iliac arteries without evidence of aneurysm. Visualized lung bases are unremarkable.  IMPRESSION: 1. No evidence of bowel lesion or inflammation. 2. Bulky left lower quadrant mesenteric calcification which is likely benign. This abuts small bowel but is not causing any inflammation or obstruction.   Electronically Signed   By: Irish Lack M.D.   On: 08/25/2014 20:04    EKG: No orders found for this or any previous visit.  ASSESSMENT AND PLAN:  Principal Problem:   GI bleed Active Problems:   Anxiety   HLD (hyperlipidemia)   HTN (hypertension)   GERD (gastroesophageal reflux disease)   Type 1 diabetes 1. Acute enterocolitis with rectal bleeding, unclear etiology at this time. Questionable inflammatory versus infectious. Continue antibiotic therapy. Get gastroenterology consultation for further recommendations, possibly EGD versus colonoscopy versus both. Continue PPIs twice daily IV 2. Rectal bleeding as above, continue PPI. Get gastroenterology consultation for scope 3. Diabetes mellitus. Type 1. on insulin pump and hypoglycemia at present, since patient is nothing by mouth.  Will start the patient on clear liquid diet, following for any signs of bleeding, continue observing patient's Accu-Cheks closely and initiate patient on D5 solution if remains hypoglycemic 4. Leukocytosis seemed to be improved significantly on antibiotic therapy   Management plans discussed with the patient, family and they are in agreement.   DRUG ALLERGIES:  Allergies  Allergen Reactions  . Morphine And Related   . Sulfa Antibiotics Hives  . Vicodin [Hydrocodone-Acetaminophen] Nausea And Vomiting  . Septra [Sulfamethoxazole-Trimethoprim] Rash    CODE STATUS:      Code Status Orders        Start     Ordered  08/25/14 2313  Full code   Continuous     08/25/14 2312      TOTAL TIME TAKING CARE OF THIS PATIENT: 45 minutes.    Katharina Caper M.D on 08/26/2014 at 12:14 PM  Between 7am to 6pm - Pager - [phone]  After 6pm go to [email or web address] - password [email or web address] [facility]  [facility]  Office  [phone]  CC: Primary care physician; Vonita Moss, MD

## 2014-08-26 NOTE — Consult Note (Signed)
GI Inpatient Consult Note  Reason for Consult: Blood per rectum   Attending Requesting Consult: [place]  History of Present Illness: Brittany MinkConstance Kathleen Zavala is a 49 y.o. female with a history of type I diabetes on an insulin pump, HTN, and CAD admitted for evaluation of abdominal pain, nausea, and bloody diarrhea since this afternoon.  [facility] with acute abdominal pain, nausea, diarrhea, and 4 episodes of BRBPR today.  In the ED, WBCs were elevated at 18,500.  CT scan of abdomen and pelvis was negative.  Patient was started on IV Cipro and Flagyl to cover for possible colitis and admitted for further work-up.  Patient reports she began having abdominal pain and chills yesterday around 12:30pm.  The diarrhea started around this time as well, beginning as loose, watery stools and then becoming bloody as the afternoon went on.  She continues to have abdominal cramping, diarrhea with bright red blood, and nausea.  She states she feels slightly better today, but not markedly improved.  The chills anShe denies any recent travel, outdoor activities, or sick contacts.  She has never experienced blood per rectum previously.  No family history of colon cancer or colon polyps.  Her nephew has crohn's disease.  Hgb and vital remain stable, WBCs have decreased to 13 since admission. Denies fever, vomiting, dysphagia, GERD, and constipation.  Last colonoscopy: Never Last EGD: Never  Past Medical History:  Past Medical History  Diagnosis Date  . Hyperlipidemia   . Hypertension   . Depression   . Anxiety   . Insomnia   . CAD (coronary artery disease)   . Chronic UTI   . Gallbladder polyp March 2016    needs f/u scan in Sept 2016  . Type 1 diabetes mellitus   . Hematuria   . Insomnia   . Tobacco abuse   . Gall bladder polyp 3/16    On UA- needs follow up scan 6 months later, (9/16)  . Furuncle of right axilla   . Current use of insulin   . ANA positive Oct 2015    1:160  speckled    Problem List: Patient Active Problem List   Diagnosis Date Noted  . GI bleed 08/25/2014  . Anxiety 08/25/2014  . HLD (hyperlipidemia) 08/25/2014  . HTN (hypertension) 08/25/2014  . GERD (gastroesophageal reflux disease) 08/25/2014  . Type 1 diabetes 08/25/2014  . Gallbladder polyp 07/16/2014  . Tobacco abuse   . Current use of insulin   . Insomnia   . CAD (coronary artery disease)   . Gallbladder polyp 04/14/2014  . ANA positive 11/13/2013    Past Surgical History: Past Surgical History  Procedure Laterality Date  . Cesarean section    . Tonsilectomy/adenoidectomy with myringotomy    . Nasal sinus surgery    . Appendectomy    . Cesarean section      x2    Allergies: Allergies  Allergen Reactions  . Morphine And Related   . Sulfa Antibiotics Hives  . Vicodin [Hydrocodone-Acetaminophen] Nausea And Vomiting  . Septra [Sulfamethoxazole-Trimethoprim] Rash    Home Medications: Prescriptions prior to admission  Medication Sig Dispense Refill Last Dose  . aspirin EC 81 MG tablet Take 81 mg by mouth daily.   Taking  . BD PEN NEEDLE NANO U/F 32G X 4 MM MISC    Taking  . busPIRone (BUSPAR) 15 MG tablet Take 15 mg by mouth 3 (three) times daily.     . clonazePAM (KLONOPIN) 0.5 MG tablet Take  0.5 mg by mouth daily as needed for anxiety.   Not Taking  . CRESTOR 10 MG tablet Take 10 mg by mouth daily.    Taking  . fluconazole (DIFLUCAN) 150 MG tablet Take 150 mg by mouth daily as needed.   Not Taking  . glucagon 1 MG injection Inject 1 mg into the vein once as needed.     Marland Kitchen HUMALOG KWIKPEN 100 UNIT/ML KiwkPen Inject 10-20 Units into the skin 3 (three) times daily.    Taking  . hydrOXYzine (VISTARIL) 25 MG capsule Take 25 mg by mouth at bedtime as needed.     Marland Kitchen LANTUS SOLOSTAR 100 UNIT/ML Solostar Pen Inject 60 Units into the skin daily at 10 pm.    Taking  . metoprolol succinate (TOPROL-XL) 25 MG 24 hr tablet Take 25 mg by mouth daily.    Taking  . nicotine (NICOTROL)  10 MG inhaler Inhale 1 continuous puffing into the lungs as needed for smoking cessation.     Marland Kitchen omeprazole (PRILOSEC) 20 MG capsule Take 20 mg by mouth daily.    Taking  . ONE TOUCH ULTRA TEST test strip    Taking  . quinapril (ACCUPRIL) 10 MG tablet Take 10 mg by mouth daily.    Taking   Home medication reconciliation was completed with the patient.   Scheduled Inpatient Medications:   . aspirin EC  81 mg Oral Daily  . busPIRone  15 mg Oral TID  . ciprofloxacin  400 mg Intravenous Q12H  . insulin aspart  0-9 Units Subcutaneous Q6H  . lisinopril  10 mg Oral Daily  . metoprolol succinate  25 mg Oral Daily  . metronidazole  500 mg Intravenous Q8H  . pantoprazole (PROTONIX) IV  40 mg Intravenous Q12H  . rosuvastatin  10 mg Oral Daily    Continuous Inpatient Infusions:   . 0.9 % NaCl with KCl 20 mEq / L 125 mL/hr at 08/26/14 1334    PRN Inpatient Medications:  acetaminophen **OR** acetaminophen, clonazePAM, nicotine, ondansetron **OR** ondansetron (ZOFRAN) IV, traMADol  Family History: family history includes Addison's disease in her mother; Cancer in her mother and sister; Dementia in her mother; Diabetes in her mother; Heart disease in her father and mother; Stroke in her mother.   Social History:   reports that she has been smoking Cigarettes.  She has never used smokeless tobacco. She reports that she does not drink alcohol or use illicit drugs.  Review of Systems: Constitutional: Weight is stable, no fever. + chills and headache Eyes: No changes in vision. ENT: No oral lesions, sore throat.  GI: see HPI.  Heme/Lymph: No easy bruising.  CV: No chest pain.  GU: No hematuria.  Integumentary: No rashes.  Neuro: No headaches.  Psych: No depression/anxiety.  Endocrine: No heat/cold intolerance.  Allergic/Immunologic: No urticaria.  Resp: No cough, SOB.  Musculoskeletal: No joint swelling.    Physical Examination: BP 142/76 mmHg  Pulse 92  Temp(Src) 98.3 F (36.8 C)  (Oral)  Resp 16  Ht 5\' 8"  (1.727 m)  Wt 102.377 kg (225 lb 11.2 oz)  BMI 34.33 kg/m2  SpO2 100%  LMP 08/20/2014 Gen: NAD, alert and oriented x 4, resting comfortably in bed HEENT: PEERLA, EOMI, Neck: supple, no JVD or thyromegaly Chest: CTA bilaterally, no wheezes, crackles, or other adventitious sounds CV: RRR, no m/g/c/r Abd: soft, NT, ND, +BS in all four quadrants; no HSM, guarding, ridigity, or rebound tenderness Ext: no edema, well perfused with 2+ pulses, Skin: no rash  or lesions noted Lymph: no LAD  Data: Lab Results  Component Value Date   WBC 13.1* 08/26/2014   HGB 12.6 08/26/2014   HCT 38.7 08/26/2014   MCV 88.8 08/26/2014   PLT 193 08/26/2014    Recent Labs Lab 08/25/14 1700 08/26/14 0525  HGB 14.4 12.6   Lab Results  Component Value Date   NA 138 08/26/2014   K 3.7 08/26/2014   CL 107 08/26/2014   CO2 22 08/26/2014   BUN 6 08/26/2014   CREATININE 0.57 08/26/2014   Lab Results  Component Value Date   ALT 21 08/25/2014   AST 23 08/25/2014   ALKPHOS 76 08/25/2014   BILITOT 0.3 08/25/2014    Recent Labs Lab 08/25/14 1700  INR 0.93   Assessment/Plan: Brittany Zavala is a 49 y.o. female with a history of type I diabetes on an insulin pump, HTN, and CAD admitted for evaluation of abdominal pain, nausea, and bright red bloody diarrhea since this afternoon.  Hgb and vitals remain stable, WBCs trending down since admission.  Symptomology suggests evolving ischemic colitis vs infection that may be early in the disease process and therefore not seen on CT scan.    Recommendations: - Plan for colonoscopy Friday morning to evaluate source of bleed - If bleeding per rectum becomes heavy, obtain bleeding scan - Follow Hgb, transfuse if <7 - Stool studies pending  Thank you for the consult. Please call with questions or concerns.  Jachai Okazaki, Addison Naegeli, MD

## 2014-08-26 NOTE — Care Management (Signed)
Met with patient to discuss discharge planning. She denies needs. Patient independent with daily acitivities. She states she lives with her husband Legrand Como. She goes to [facility] as PCP. She uses [employer]. New insulin pump per RN. No RNCM needs.

## 2014-08-27 DIAGNOSIS — A045 Campylobacter enteritis: Secondary | ICD-10-CM

## 2014-08-27 DIAGNOSIS — D72829 Elevated white blood cell count, unspecified: Secondary | ICD-10-CM

## 2014-08-27 DIAGNOSIS — E11649 Type 2 diabetes mellitus with hypoglycemia without coma: Secondary | ICD-10-CM

## 2014-08-27 DIAGNOSIS — K55059 Acute (reversible) ischemia of intestine, part and extent unspecified: Secondary | ICD-10-CM

## 2014-08-27 DIAGNOSIS — R109 Unspecified abdominal pain: Secondary | ICD-10-CM

## 2014-08-27 LAB — CBC
HEMATOCRIT: 39 % (ref 35.0–47.0)
Hemoglobin: 12.9 g/dL (ref 12.0–16.0)
MCH: 29.1 pg (ref 26.0–34.0)
MCHC: 33 g/dL (ref 32.0–36.0)
MCV: 88.1 fL (ref 80.0–100.0)
Platelets: 184 10*3/uL (ref 150–440)
RBC: 4.43 MIL/uL (ref 3.80–5.20)
RDW: 14.6 % — AB (ref 11.5–14.5)
WBC: 13.9 10*3/uL — ABNORMAL HIGH (ref 3.6–11.0)

## 2014-08-27 LAB — GLUCOSE, CAPILLARY
GLUCOSE-CAPILLARY: 95 mg/dL (ref 65–99)
Glucose-Capillary: 64 mg/dL — ABNORMAL LOW (ref 65–99)
Glucose-Capillary: 118 mg/dL — ABNORMAL HIGH (ref 65–99)

## 2014-08-27 MED ORDER — MAGNESIUM CITRATE PO SOLN
1.0000 | Freq: Once | ORAL | Status: DC
  Filled 2014-08-27: qty 296

## 2014-08-27 MED ORDER — CIPROFLOXACIN HCL 500 MG PO TABS: 500.0000 mg | ORAL_TABLET | Freq: Two times a day (BID) | ORAL | Status: DC

## 2014-08-27 MED ORDER — SODIUM CHLORIDE 0.9 % IV SOLN: INTRAVENOUS | Status: DC

## 2014-08-27 MED ORDER — POLYETHYLENE GLYCOL 3350 17 GM/SCOOP PO POWD
1.0000 | Freq: Once | ORAL | Status: DC
  Filled 2014-08-27: qty 255

## 2014-08-27 MED ORDER — TRAMADOL HCL 50 MG PO TABS: 50.0000 mg | ORAL_TABLET | Freq: Four times a day (QID) | ORAL | Status: DC | PRN

## 2014-08-27 MED ORDER — ONDANSETRON HCL 4 MG PO TABS: 4.0000 mg | ORAL_TABLET | Freq: Four times a day (QID) | ORAL | Status: DC | PRN

## 2014-08-27 MED ORDER — INSULIN PUMP
Freq: Three times a day (TID) | SUBCUTANEOUS | Status: DC
  Administered 2014-08-27: 4.2 via SUBCUTANEOUS
  Filled 2014-08-27: qty 1

## 2014-08-27 MED ORDER — METRONIDAZOLE 500 MG PO TABS: 500.0000 mg | ORAL_TABLET | Freq: Three times a day (TID) | ORAL | Status: DC

## 2014-08-27 NOTE — Discharge Summary (Signed)
[facility] - [place] at [facility]   PATIENT NAME: Brittany Zavala    MR#:  [id number]  DATE OF BIRTH:  28-May-1965  DATE OF ADMISSION:  08/25/2014 ADMITTING PHYSICIAN: Oralia Manis, MD  DATE OF DISCHARGE: No discharge date for patient encounter.  PRIMARY CARE PHYSICIAN: Vonita Moss, MD     ADMISSION DIAGNOSIS:  Colitis [K52.9] Lower GI bleed [K92.2]  DISCHARGE DIAGNOSIS:  Principal Problem:   Acute hemorrhagic enterocolitis Active Problems:   GI bleed   Hypoglycemia associated with diabetes   Leukocytosis   Anxiety   HLD (hyperlipidemia)   HTN (hypertension)   GERD (gastroesophageal reflux disease)   Type 1 diabetes   Abdominal pain   SECONDARY DIAGNOSIS:   Past Medical History  Diagnosis Date  . Hyperlipidemia   . Hypertension   . Depression   . Anxiety   . Insomnia   . CAD (coronary artery disease)   . Chronic UTI   . Gallbladder polyp March 2016    needs f/u scan in Sept 2016  . Type 1 diabetes mellitus   . Hematuria   . Insomnia   . Tobacco abuse   . Gall bladder polyp 3/16    On UA- needs follow up scan 6 months later, (9/16)  . Furuncle of right axilla   . Current use of insulin   . ANA positive Oct 2015    1:160 speckled    .pro HOSPITAL COURSE:   Patient is 49 year old female with history of diabetes, hypertension, hyperlipidemia, who presented with diarrhea, bloody stool, cramping abdominal pain in lower abdomen. In ER she was noted to have high WBC count, 18 thousand. CT of abdomen and pelvis was unremarkable, patient was initiated on antibiotics with Cipro and Flagyl IV and was admitted, consultation with gastroenterologist was obtained. Stool culture grew Campylobacter, with conservative measures patient's bleeding subsided to minimal. Hemoglobin and blood pressure remained stable Discussion by problem: 1. Acute enterocolitis with rectal bleeding, etiology is Campylobacter infection. Continue Cipro therapy to  complete 10 day course. Appreciate gastroenterology recommendations. Advance diet as tolerated. 2. Rectal bleeding , due to infection likely, subsiding, GI follow as as needed 3. Diabetes mellitus. Type 1. Hemoglobin A1c 7.1 on insulin pump and had hypoglycemia initially, as patient was nothing by mouth. Advance diet to diabetic 4. Leukocytosis improved on antibiotic therapy, stable today  DISCHARGE CONDITIONS:   fair  CONSULTS OBTAINED:  Treatment Team:  Elnita Maxwell, MD  DRUG ALLERGIES:   Allergies  Allergen Reactions  . Morphine And Related   . Sulfa Antibiotics Hives  . Vicodin [Hydrocodone-Acetaminophen] Nausea And Vomiting  . Septra [Sulfamethoxazole-Trimethoprim] Rash    DISCHARGE MEDICATIONS:   Current Discharge Medication List    START taking these medications   Details  ciprofloxacin (CIPRO) 500 MG tablet Take 1 tablet (500 mg total) by mouth 2 (two) times daily. Qty: 16 tablet, Refills: 0    ondansetron (ZOFRAN) 4 MG tablet Take 1 tablet (4 mg total) by mouth every 6 (six) hours as needed for nausea. Qty: 20 tablet, Refills: 0    traMADol (ULTRAM) 50 MG tablet Take 1 tablet (50 mg total) by mouth every 6 (six) hours as needed for moderate pain. Qty: 30 tablet, Refills: 1      CONTINUE these medications which have NOT CHANGED   Details  BD PEN NEEDLE NANO U/F 32G X 4 MM MISC     busPIRone (BUSPAR) 15 MG tablet Take 15 mg by mouth 3 (three)  times daily.    clonazePAM (KLONOPIN) 0.5 MG tablet Take 0.5 mg by mouth daily as needed for anxiety.    CRESTOR 10 MG tablet Take 10 mg by mouth daily.     fluconazole (DIFLUCAN) 150 MG tablet Take 150 mg by mouth daily as needed.    glucagon 1 MG injection Inject 1 mg into the vein once as needed.    HUMALOG KWIKPEN 100 UNIT/ML KiwkPen Inject 10-20 Units into the skin 3 (three) times daily.     hydrOXYzine (VISTARIL) 25 MG capsule Take 25 mg by mouth at bedtime as needed.    LANTUS SOLOSTAR 100 UNIT/ML  Solostar Pen Inject 60 Units into the skin daily at 10 pm.     metoprolol succinate (TOPROL-XL) 25 MG 24 hr tablet Take 25 mg by mouth daily.     nicotine (NICOTROL) 10 MG inhaler Inhale 1 continuous puffing into the lungs as needed for smoking cessation.    omeprazole (PRILOSEC) 20 MG capsule Take 20 mg by mouth daily.     ONE TOUCH ULTRA TEST test strip     quinapril (ACCUPRIL) 10 MG tablet Take 10 mg by mouth daily.       STOP taking these medications     aspirin EC 81 MG tablet          DISCHARGE INSTRUCTIONS:    Follow up with GI as needed  If you experience worsening of your admission symptoms, develop shortness of breath, life threatening emergency, suicidal or homicidal thoughts you must seek medical attention immediately by calling 911 or calling your MD immediately  if symptoms less severe.  You Must read complete instructions/literature along with all the possible adverse reactions/side effects for all the Medicines you take and that have been prescribed to you. Take any new Medicines after you have completely understood and accept all the possible adverse reactions/side effects.   Please note  You were cared for by a hospitalist during your hospital stay. If you have any questions about your discharge medications or the care you received while you were in the hospital after you are discharged, you can call the unit and asked to speak with the hospitalist on call if the hospitalist that took care of you is not available. Once you are discharged, your primary care physician will handle any further medical issues. Please note that NO REFILLS for any discharge medications will be authorized once you are discharged, as it is imperative that you return to your primary care physician (or establish a relationship with a primary care physician if you do not have one) for your aftercare needs so that they can reassess your need for medications and monitor your lab  values.    Today   CHIEF COMPLAINT:   Chief Complaint  Patient presents with  . Diarrhea    HISTORY OF PRESENT ILLNESS:  Brittany Zavala  is a 49 y.o. female with a known history of diabetes, hypertension, hyperlipidemia, who presented with diarrhea, bloody stool, cramping abdominal pain in lower abdomen. In ER she was noted to have high WBC count, 18 thousand. CT of abdomen and pelvis was unremarkable, patient was initiated on antibiotics with Cipro and Flagyl IV and was admitted, consultation with gastroenterologist was obtained. Stool culture grew Campylobacter, with conservative measures patient's bleeding subsided to minimal. Hemoglobin and blood pressure remained stable Discussion by problem: 1. Acute enterocolitis with rectal bleeding, etiology is Campylobacter infection. Continue Cipro therapy to complete 10 day course. Appreciate gastroenterology recommendations. Advance diet as  tolerated. 2. Rectal bleeding , due to infection likely, subsiding, GI follow as as needed 3. Diabetes mellitus. Type 1. Hemoglobin A1c 7.1 on insulin pump and had hypoglycemia initially, as patient was nothing by mouth. Advance diet to diabetic 4. Leukocytosis improved on antibiotic therapy, stable today   VITAL SIGNS:  Blood pressure 130/68, pulse 66, temperature 98.6 F (37 C), temperature source Oral, resp. rate 16, height 5\' 8"  (1.727 m), weight 102.377 kg (225 lb 11.2 oz), last menstrual period 08/20/2014, SpO2 100 %.  I/O:   Intake/Output Summary (Last 24 hours) at 08/27/14 1809 Last data filed at 08/27/14 0900  Gross per 24 hour  Intake      0 ml  Output      0 ml  Net      0 ml    PHYSICAL EXAMINATION:  GENERAL:  49 y.o.-year-old patient lying in the bed with no acute distress.  EYES: Pupils equal, round, reactive to light and accommodation. No scleral icterus. Extraocular muscles intact.  HEENT: Head atraumatic, normocephalic. Oropharynx and nasopharynx clear.  NECK:  Supple, no  jugular venous distention. No thyroid enlargement, no tenderness.  LUNGS: Normal breath sounds bilaterally, no wheezing, rales,rhonchi or crepitation. No use of accessory muscles of respiration.  CARDIOVASCULAR: S1, S2 normal. No murmurs, rubs, or gallops.  ABDOMEN: Soft, non-tender, non-distended. Bowel sounds present. No organomegaly or mass.  EXTREMITIES: No pedal edema, cyanosis, or clubbing.  NEUROLOGIC: Cranial nerves II through XII are intact. Muscle strength 5/5 in all extremities. Sensation intact. Gait not checked.  PSYCHIATRIC: The patient is alert and oriented x 3.  SKIN: No obvious rash, lesion, or ulcer.   DATA REVIEW:   CBC  Recent Labs Lab 08/27/14 0615  WBC 13.9*  HGB 12.9  HCT 39.0  PLT 184    Chemistries   Recent Labs Lab 08/25/14 1700 08/26/14 0525  NA 136 138  K 4.0 3.7  CL 105 107  CO2 22 22  GLUCOSE 169* 185*  BUN 10 6  CREATININE 0.69 0.57  CALCIUM 9.2 8.6*  AST 23  --   ALT 21  --   ALKPHOS 76  --   BILITOT 0.3  --     Cardiac Enzymes No results for input(s): TROPONINI in the last 168 hours.  Microbiology Results  Results for orders placed or performed during the hospital encounter of 08/25/14  C difficile quick scan w PCR reflex ([facility] only)     Status: None   Collection Time: 08/26/14 10:35 AM  Result Value Ref Range Status   C Diff antigen NEGATIVE  Final   C Diff toxin NEGATIVE  Final   C Diff interpretation Negative for C. difficile  Final  Stool culture     Status: None (Preliminary result)   Collection Time: 08/26/14 11:00 AM  Result Value Ref Range Status   Specimen Description STOOL  Final   Special Requests Normal  Final   Culture   Final    TOO YOUNG TO READ CAMPYLOBACTER DETECTED Results Called to: BRENDA BECKER AT 1610 08/27/14 CTJ FAXED TO [facility] DEPT.    Report Status PENDING  Incomplete    RADIOLOGY:  Ct Abdomen Pelvis W Contrast  08/25/2014   CLINICAL DATA:  Diffuse abdominal pain and blood in  stool.  EXAM: CT ABDOMEN AND PELVIS WITH CONTRAST  TECHNIQUE: Multidetector CT imaging of the abdomen and pelvis was performed using the standard protocol following bolus administration of intravenous contrast.  CONTRAST:  OMNIPAQUE IOHEXOL  350 MG/ML SOLN  COMPARISON:  None.  FINDINGS: Small to moderate-sized hiatal hernia present. The liver, gallbladder, pancreas, spleen, adrenal glands and kidneys are within normal limits.  Bowel shows no evidence of obstruction or inflammation. No focal bowel lesion is identified. There is a focal area of bulky mesenteric calcification in the left lower quadrant measuring approximately 11 mm in diameter lying adjacent to small bowel. This is not causing bowel obstruction and is not associated with a soft tissue mass or surrounding inflammation.  No abnormal fluid collection or abscess is seen. No lymphadenopathy identified. No hernias are seen. The bladder is unremarkable. The uterus appears unremarkable by CT. 1.8 cm cyst of the left ovary is likely physiologic. No bony abnormalities are identified. Calcified plaque is present in the distal aorta bilateral common iliac arteries without evidence of aneurysm. Visualized lung bases are unremarkable.  IMPRESSION: 1. No evidence of bowel lesion or inflammation. 2. Bulky left lower quadrant mesenteric calcification which is likely benign. This abuts small bowel but is not causing any inflammation or obstruction.   Electronically Signed   By: Irish Lack M.D.   On: 08/25/2014 20:04    EKG:  No orders found for this or any previous visit.    Management plans discussed with the patient, family and they are in agreement.  CODE STATUS:     Code Status Orders        Start     Ordered   08/25/14 2313  Full code   Continuous     08/25/14 2312      TOTAL TIME TAKING CARE OF THIS PATIENT: 40 minutes.    Katharina Caper M.D on 08/27/2014 at 6:09 PM  Between 7am to 6pm - Pager - [phone]  After 6pm go to  [email or web address] - password [email or web address] [facility]  [facility]  Office  [phone]  CC: Primary care physician; Vonita Moss, MD

## 2014-08-27 NOTE — Progress Notes (Signed)
Pt discharged;discharge instructions given, saline lock d/c , discharged ambulatory with care of husband

## 2014-08-27 NOTE — Consult Note (Signed)
GI Inpatient Follow-up Note  Patient Identification: Brittany Zavala is a 49 y.o. female wit rectal bleeding, abd pain  Subjective: Bleedign resolved. One small amt blood this morning, none since. Abd pain much improved. No f/c Stool study + for campylobacter  Scheduled Inpatient Medications:  . aspirin EC  81 mg Oral Daily  . busPIRone  15 mg Oral TID  . ciprofloxacin  400 mg Intravenous Q12H  . insulin pump   Subcutaneous TID AC, HS, 0200  . lisinopril  10 mg Oral Daily  . [START ON 08/28/2014] magnesium citrate  1 Bottle Oral Once  . metoprolol succinate  25 mg Oral Daily  . metronidazole  500 mg Intravenous Q8H  . pantoprazole (PROTONIX) IV  40 mg Intravenous Q12H  . polyethylene glycol powder  1 Container Oral Once  . rosuvastatin  10 mg Oral Daily    Continuous Inpatient Infusions:   . sodium chloride    . 0.9 % NaCl with KCl 20 mEq / L 125 mL/hr at 08/27/14 1308    PRN Inpatient Medications:  acetaminophen **OR** acetaminophen, clonazePAM, nicotine, ondansetron **OR** ondansetron (ZOFRAN) IV, traMADol     Physical Examination: BP 130/68 mmHg  Pulse 66  Temp(Src) 98.6 F (37 C) (Oral)  Resp 16  Ht 5\' 8"  (1.727 m)  Wt 102.377 kg (225 lb 11.2 oz)  BMI 34.33 kg/m2  SpO2 100%  LMP 08/20/2014 Gen: NAD, alert and oriented x 4 HEENT: PEERLA, EOMI, Neck: supple, no JVD or thyromegaly Chest: CTA bilaterally, no wheezes, crackles, or other adventitious sounds CV: RRR, no m/g/c/r Abd: soft, mild diffuse TTP, +BS in all four quadrants; no HSM, guarding, ridigity, or rebound tenderness Ext: no edema, well perfused with 2+ pulses, Skin: no rash or lesions noted Lymph: no LAD  Data: Lab Results  Component Value Date   WBC 13.9* 08/27/2014   HGB 12.9 08/27/2014   HCT 39.0 08/27/2014   MCV 88.1 08/27/2014   PLT 184 08/27/2014    Recent Labs Lab 08/25/14 1700 08/26/14 0525 08/27/14 0615  HGB 14.4 12.6 12.9   Lab Results  Component Value Date   NA 138 08/26/2014   K 3.7 08/26/2014   CL 107 08/26/2014   CO2 22 08/26/2014   BUN 6 08/26/2014   CREATININE 0.57 08/26/2014   Lab Results  Component Value Date   ALT 21 08/25/2014   AST 23 08/25/2014   ALKPHOS 76 08/25/2014   BILITOT 0.3 08/25/2014    Recent Labs Lab 08/25/14 1700  INR 0.93   Assessment/Plan: Ms. Brittany Zavala is a 49 y.o. female with rectal bleeding, abd pain that are now resolved.  Stool + pos for campylobacter.   Recommendations: - no indication for colonoscopy since campylobacter explains the bleeding and abd pain.  - safe for d/c since bleeding and abd pain resolved and hgb stable - complete 7 day course of abx  Please call with questions or concerns.  Brittany Zavala, Addison NaegeliMATTHEW GORDON, MD

## 2014-08-27 NOTE — Progress Notes (Signed)
Lab called to report stool positive for campho bacteria , this was called to Dr Winona LegatoVaickute

## 2014-08-27 NOTE — Progress Notes (Signed)
Inpatient Diabetes Program Recommendations  AACE/ADA: New Consensus Statement on Inpatient Glycemic Control (2013)  Target Ranges:  Prepandial:   less than 140 mg/dL      Peak postprandial:   less than 180 mg/dL (1-2 hours)      Critically ill patients:  140 - 180 mg/dL   Reason for Visit: insulin pump assessment  Diabetes history: Type 1 diabetes Outpatient Diabetes medications: Novolog via insulin pump T-slim G4 with continuous glucose monitor Current pump settings: :   Time  Basal rate       Carb ratio Correction factor  Goal blood sugar   (units/hour)                (mg/dl) 12midnight 0.9811.675  1:911:20   1:4.5    120  6am  1.875  1:30   1:3.5    120  11am  1.75  1:25   1:4    120  5pm  1.95  1:25   1:4.5    120    Insulin pump is located on the loser left abdomen- changed 3 days ago CGMS is located on the lower right abdomen- changed 7 days ago (as recommended by manufacturer)  Both sites are going to be changed today when her daughter brings supplies.  The patient has only been wearing the pump for 2 weeks and a Certified Diabetes Educator [facility] making adjustments nightly- may need to reassess rates if patient makes changes this evening while she is inpatient.   Contract signed, patient will track CHO, and insulin delivery.  Brittany RacerJulie Nathyn Luiz, RN, BA, MHA, CDE Diabetes Coordinator Inpatient Diabetes Program  [phone] (Team Pager) [phone] [facility] Office) 08/27/2014 11:46 AM

## 2014-08-27 NOTE — Progress Notes (Signed)
Inpatient Diabetes Program Recommendations  AACE/ADA: New Consensus Statement on Inpatient Glycemic Control (2013)  Target Ranges:  Prepandial:   less than 140 mg/dL      Peak postprandial:   less than 180 mg/dL (1-2 hours)      Critically ill patients:  140 - 180 mg/dL   Results for Brittany Zavala, Brittany Zavala (MRN [id number]) as of 08/27/2014 08:40  Ref. Range 08/26/2014 06:21 08/26/2014 11:23 08/26/2014 17:20 08/26/2014 23:51 08/27/2014 06:04  Glucose-Capillary Latest Ref Range: 65-99 mg/dL 657178 (H) 846101 (H) 962185 (H) 172 (H) 64 (L)    Diabetes history: DM1 Outpatient Diabetes medications: insulin pump with Humalog Current orders for Inpatient glycemic control: Novolog 0-9 units TID with meals  Note: According to the H&P, patient uses an insulin pump for glycemic control and has Type 1 diabetes. Please discontinue current glycemic control order set with Novolog 0-9 units TID. Since patient is using her insulin pump as an inpatient, the Insulin Pump Order Set must be ordered. Please order the Insulin Pump Order Set with CBGs ACHS and 2am.  NURSING: Once the insulin pump order set is ordered, please have patient sign patient contract and ask patient to fill out the patient flow sheet (which provides amount of insulin given by pump for glucose and carbohydrates consumed). Also, please complete insulin pump assessment in the chart under Insulin Pump flowsheet once a shift.  Called patient over the phone to talk with her regarding her insulin pump and fasting glucose of 64 mg/dl this morning. Patient stated that she has her insulin pump with Humalog insulin on currently and that she did not feel symptomatic from glucose of 64 mg/dl this morning. Patient then stated "I have an endocrinologist and CDE that I am working with and I am tired and don't feel like talking to you right now". Informed patient that I would ask one of my co-workers to come by today and talk with her and to obtain her insulin pump  settings and patient stated that would be fine. Called RN caring for patient today and asked that she call attending MD and obtain an order for the insulin pump.  Thanks, Orlando PennerMarie Oma Marzan, RN, MSN, CCRN, CDE Diabetes Coordinator Inpatient Diabetes Program [phone] (Team Pager from 8am to 5pm) [phone] ([facility] office) [phone] [facility] office) [phone] [facility] office)

## 2014-08-27 NOTE — Progress Notes (Signed)
[facility] [place] [facility]   PATIENT NAME: Brittany LegatoConstance Zavala    MR#:  [id number]  DATE OF BIRTH:  [birth year]  SUBJECTIVE:  CHIEF COMPLAINT:   Chief Complaint  Patient presents with  . Diarrhea   the patient presented to the hospital with nausea ,  diarrhea and rectal bleeding as well as chills. In emergency room, she was noted to have marked leukocytosis with white blood cell count 18,000 and a CT scan of the abdomen and pelvis was performed. CT scan revealed left lower quadrant mesenteric calcification which was felt to be benign but otherwise no lesions or inflammation or obstruction. Patient was initiated on ciprofloxacin and Flagyl IV. She denies any more bleeding, although per nursing staff she did have small amount of blood rectally earlier today. White blood cell count is improving with current therapy and hemoglobin remains stable, although slightly lower than on admission. Blood Glucose levels are lower, patient is nothing by mouth so far. Complains of lower abdominal pain and cramping sensation just prior to defecation. Complains of headache and requests nonsteroidal anti-inflammatory medications which she takes intermittently. Refuses Tylenol due to history of problems with liver is as well as interference with "blood glucose readings": Still has intermittent bright red blood blood per rectum but a small amount, hemoglobin level remains stable. Abdominal pain is resolving. Patient wants to go home. Was seen by Dr. Shelle Ironein who recommended to be discharged home if no bleeding and follow-up with him in about 1 week, per report since now note from gastroenterologist is available on the computer at this time Review of Systems  Constitutional: Negative for fever, chills and weight loss.  HENT: Negative for congestion.   Eyes: Negative for blurred vision and double vision.  Respiratory: Negative for cough, sputum production, shortness of breath and wheezing.    Cardiovascular: Negative for chest pain, palpitations, orthopnea, leg swelling and PND.  Gastrointestinal: Positive for nausea, abdominal pain, diarrhea and blood in stool. Negative for vomiting and constipation.  Genitourinary: Negative for dysuria, urgency, frequency and hematuria.  Musculoskeletal: Negative for falls.  Neurological: Negative for dizziness, tremors, focal weakness and headaches.  Endo/Heme/Allergies: Does not bruise/bleed easily.  Psychiatric/Behavioral: Negative for depression. The patient does not have insomnia.     VITAL SIGNS: Blood pressure 139/77, pulse 79, temperature 98.6 F (37 C), temperature source Oral, resp. rate 16, height 5\' 8"  (1.727 m), weight 102.377 kg (225 lb 11.2 oz), last menstrual period 08/20/2014, SpO2 99 %.  PHYSICAL EXAMINATION:   GENERAL:  49 y.o.-year-old patient lying in the bed with no acute distress. Moist oral mucosa EYES: Pupils equal, round, reactive to light and accommodation. No scleral icterus. Extraocular muscles intact.  HEENT: Head atraumatic, normocephalic. Oropharynx and nasopharynx clear.  NECK:  Supple, no jugular venous distention. No thyroid enlargement, no tenderness.  LUNGS: Normal breath sounds bilaterally, no wheezing, rales,rhonchi or crepitation. No use of accessory muscles of respiration.  CARDIOVASCULAR: S1, S2 normal. No murmurs, rubs, or gallops.  ABDOMEN: Soft, no pain on palpation in lower part of abdomen. There is no rebound or guarding, nondistended. Bowel sounds present. No organomegaly or mass.  EXTREMITIES: No pedal edema, cyanosis, or clubbing.  NEUROLOGIC: Cranial nerves II through XII are intact. Muscle strength 5/5 in all extremities. Sensation intact. Gait not checked.  PSYCHIATRIC: The patient is alert and oriented x 3.  SKIN: No obvious rash, lesion, or ulcer.   ORDERS/RESULTS REVIEWED:   CBC  Recent Labs Lab 08/25/14 1700 08/26/14 0525  08/27/14 0615  WBC 18.5* 13.1* 13.9*  HGB 14.4 12.6  12.9  HCT 43.8 38.7 39.0  PLT 216 193 184  MCV 88.3 88.8 88.1  MCH 29.0 28.8 29.1  MCHC 32.8 32.4 33.0  RDW 14.5 14.4 14.6*  LYMPHSABS 1.6  --   --   MONOABS 0.8  --   --   EOSABS 0.0  --   --   BASOSABS 0.0  --   --    ------------------------------------------------------------------------------------------------------------------  Chemistries   Recent Labs Lab 08/25/14 1700 08/26/14 0525  NA 136 138  K 4.0 3.7  CL 105 107  CO2 22 22  GLUCOSE 169* 185*  BUN 10 6  CREATININE 0.69 0.57  CALCIUM 9.2 8.6*  AST 23  --   ALT 21  --   ALKPHOS 76  --   BILITOT 0.3  --    ------------------------------------------------------------------------------------------------------------------ estimated creatinine clearance is 108.8 mL/min (by C-G formula based on Cr of 0.57). ------------------------------------------------------------------------------------------------------------------ No results for input(s): TSH, T4TOTAL, T3FREE, THYROIDAB in the last 72 hours.  Invalid input(s): FREET3  Cardiac Enzymes No results for input(s): CKMB, TROPONINI, MYOGLOBIN in the last 168 hours.  Invalid input(s): CK ------------------------------------------------------------------------------------------------------------------ Invalid input(s): POCBNP ---------------------------------------------------------------------------------------------------------------  RADIOLOGY: Ct Abdomen Pelvis W Contrast  08/25/2014   CLINICAL DATA:  Diffuse abdominal pain and blood in stool.  EXAM: CT ABDOMEN AND PELVIS WITH CONTRAST  TECHNIQUE: Multidetector CT imaging of the abdomen and pelvis was performed using the standard protocol following bolus administration of intravenous contrast.  CONTRAST:  OMNIPAQUE IOHEXOL 350 MG/ML SOLN  COMPARISON:  None.  FINDINGS: Small to moderate-sized hiatal hernia present. The liver, gallbladder, pancreas, spleen, adrenal glands and kidneys are within normal limits.   Bowel shows no evidence of obstruction or inflammation. No focal bowel lesion is identified. There is a focal area of bulky mesenteric calcification in the left lower quadrant measuring approximately 11 mm in diameter lying adjacent to small bowel. This is not causing bowel obstruction and is not associated with a soft tissue mass or surrounding inflammation.  No abnormal fluid collection or abscess is seen. No lymphadenopathy identified. No hernias are seen. The bladder is unremarkable. The uterus appears unremarkable by CT. 1.8 cm cyst of the left ovary is likely physiologic. No bony abnormalities are identified. Calcified plaque is present in the distal aorta bilateral common iliac arteries without evidence of aneurysm. Visualized lung bases are unremarkable.  IMPRESSION: 1. No evidence of bowel lesion or inflammation. 2. Bulky left lower quadrant mesenteric calcification which is likely benign. This abuts small bowel but is not causing any inflammation or obstruction.   Electronically Signed   By: Irish Lack M.D.   On: 08/25/2014 20:04    EKG: No orders found for this or any previous visit.  ASSESSMENT AND PLAN:  Principal Problem:   Acute hemorrhagic enterocolitis Active Problems:   GI bleed   Hypoglycemia associated with diabetes   Leukocytosis   Anxiety   HLD (hyperlipidemia)   HTN (hypertension)   GERD (gastroesophageal reflux disease)   Type 1 diabetes   Abdominal pain 1. Acute enterocolitis with rectal bleeding, unclear etiology at this time. Questionable inflammatory versus infectious. Continue antibiotic therapy to complete course for at least 10 days. Get gastroenterology consultation for further recommendations, possibly  colonoscopy as outpatient, unless patient's bleeding does not subside in next 24-48 hours. Advance diet to full liquids 2. Rectal bleeding as above, continue PPI. Get gastroenterology consultation for scope likely in outpatient settings 3. Diabetes mellitus.  Type 1. Hemoglobin A1c 7.1 on insulin pump and had hypoglycemia initially, since patient was nothing by mouth. It ends at the full liquid diet and the soft diet if tolerated. Remains somewhat hypoglycemic in the morning . Now off insulin pump 4. Leukocytosis seemed to be improved on antibiotic therapy, stable today   Management plans discussed with the patient, family and they are in agreement.   DRUG ALLERGIES:  Allergies  Allergen Reactions  . Morphine And Related   . Sulfa Antibiotics Hives  . Vicodin [Hydrocodone-Acetaminophen] Nausea And Vomiting  . Septra [Sulfamethoxazole-Trimethoprim] Rash    CODE STATUS:     Code Status Orders        Start     Ordered   08/25/14 2313  Full code   Continuous     08/25/14 2312      TOTAL TIME TAKING CARE OF THIS PATIENT: 40 minutes.    Katharina Caper M.D on 08/27/2014 at 1:05 PM  Between 7am to 6pm - Pager - [phone]  After 6pm go to [email or web address] - password [email or web address] [facility]  [facility]  Office  [phone]  CC: Primary care physician; Vonita Moss, MD

## 2014-08-28 SURGERY — COLONOSCOPY WITH PROPOFOL
Anesthesia: General

## 2014-08-29 LAB — STOOL CULTURE
CULTURE: NORMAL
SPECIAL REQUESTS: NORMAL

## 2014-09-05 LAB — HM DIABETES EYE EXAM

## 2014-09-07 ENCOUNTER — Inpatient Hospital Stay: Payer: Self-pay | Admitting: Family Medicine

## 2014-09-10 ENCOUNTER — Other Ambulatory Visit: Payer: Self-pay | Admitting: Family Medicine

## 2014-09-11 NOTE — Telephone Encounter (Signed)
I think she's yours

## 2014-09-21 ENCOUNTER — Other Ambulatory Visit: Payer: Self-pay | Admitting: Family Medicine

## 2014-10-15 ENCOUNTER — Telehealth: Payer: Self-pay

## 2014-10-15 DIAGNOSIS — K824 Cholesterolosis of gallbladder: Secondary | ICD-10-CM

## 2014-10-15 NOTE — Telephone Encounter (Signed)
Chart reviewed; moved f/u US date to around Jan 26, 2015

## 2014-10-15 NOTE — Assessment & Plan Note (Signed)
6 mm polyp [place] July 27, 2014, 6 month f/u due mid-December 2016

## 2014-10-15 NOTE — Telephone Encounter (Signed)
I had a recall message show up in [facility] today stating she is due for a Gallbladder U/S. Follow up on polyp, due for 6 month f/u mid Sept. 2016. I'm not sure if she needs it, this was dated 05/15/14 and she had another ultrasound in June and a CT scan in July.

## 2014-11-27 ENCOUNTER — Other Ambulatory Visit: Payer: Self-pay | Admitting: Family Medicine

## 2014-11-27 NOTE — Telephone Encounter (Signed)
approved

## 2014-12-10 ENCOUNTER — Ambulatory Visit (INDEPENDENT_AMBULATORY_CARE_PROVIDER_SITE_OTHER): Payer: BLUE CROSS/BLUE SHIELD | Admitting: Family Medicine

## 2014-12-10 ENCOUNTER — Encounter: Payer: Self-pay | Admitting: Family Medicine

## 2014-12-10 ENCOUNTER — Other Ambulatory Visit: Payer: Self-pay | Admitting: Family Medicine

## 2014-12-10 VITALS — BP 150/83 | HR 60 | Temp 98.7°F | Ht 68.7 in | Wt 232.6 lb

## 2014-12-10 DIAGNOSIS — E108 Type 1 diabetes mellitus with unspecified complications: Secondary | ICD-10-CM

## 2014-12-10 DIAGNOSIS — D72829 Elevated white blood cell count, unspecified: Secondary | ICD-10-CM | POA: Diagnosis not present

## 2014-12-10 DIAGNOSIS — R319 Hematuria, unspecified: Secondary | ICD-10-CM

## 2014-12-10 DIAGNOSIS — N3001 Acute cystitis with hematuria: Secondary | ICD-10-CM | POA: Diagnosis not present

## 2014-12-10 DIAGNOSIS — R300 Dysuria: Secondary | ICD-10-CM | POA: Diagnosis not present

## 2014-12-10 DIAGNOSIS — I10 Essential (primary) hypertension: Secondary | ICD-10-CM

## 2014-12-10 DIAGNOSIS — I25119 Atherosclerotic heart disease of native coronary artery with unspecified angina pectoris: Secondary | ICD-10-CM

## 2014-12-10 DIAGNOSIS — Z794 Long term (current) use of insulin: Secondary | ICD-10-CM

## 2014-12-10 DIAGNOSIS — E785 Hyperlipidemia, unspecified: Secondary | ICD-10-CM | POA: Diagnosis not present

## 2014-12-10 DIAGNOSIS — E11649 Type 2 diabetes mellitus with hypoglycemia without coma: Secondary | ICD-10-CM

## 2014-12-10 DIAGNOSIS — G4700 Insomnia, unspecified: Secondary | ICD-10-CM | POA: Diagnosis not present

## 2014-12-10 LAB — MICROSCOPIC EXAMINATION
Epithelial Cells (non renal): NONE SEEN /hpf (ref 0–10)
RBC MICROSCOPIC, UA: NONE SEEN /HPF (ref 0–?)
WBC UA: NONE SEEN /HPF (ref 0–?)

## 2014-12-10 LAB — MICROALBUMIN, URINE WAIVED
Creatinine, Urine Waived: 10 mg/dL (ref 10–300)
Microalb, Ur Waived: 10 mg/L (ref 0–19)
Microalb/Creat Ratio: <30 mg/g (ref <30)

## 2014-12-10 MED ORDER — QUINAPRIL HCL 10 MG PO TABS: 15.0000 mg | ORAL_TABLET | Freq: Every day | ORAL | Status: DC

## 2014-12-10 MED ORDER — CIPROFLOXACIN HCL 500 MG PO TABS: 500.0000 mg | ORAL_TABLET | Freq: Two times a day (BID) | ORAL | Status: DC

## 2014-12-10 MED ORDER — PROMETHAZINE HCL 12.5 MG PO TABS
12.5000 mg | ORAL_TABLET | Freq: Three times a day (TID) | ORAL | Status: DC | PRN
Start: 2014-12-10 — End: 2014-12-24

## 2014-12-10 NOTE — Assessment & Plan Note (Signed)
Has been elevated. Will increase her quinapril to 15mg  daily and recheck in 1 month. CMP and microalbumin checked today.

## 2014-12-10 NOTE — Assessment & Plan Note (Signed)
Doing much better since being on her pump, Continue to follow with endocrinology. Continue current regimen. A1c checked 2 weeks ago 

## 2014-12-10 NOTE — Assessment & Plan Note (Signed)
Checking CBC today 

## 2014-12-10 NOTE — Progress Notes (Signed)
BP 150/83 mmHg  Pulse 60  Temp(Src) 98.7 F (37.1 C)  Ht 5' 8.7" (1.745 m)  Wt 232 lb 9.6 oz (105.507 kg)  BMI 34.65 kg/m2  SpO2 100%   Subjective:    Patient ID: Brittany Zavala, female    DOB: 09-25-1965, 49 y.o.   MRN: [id number]  HPI: Brittany Zavala is a 49 y.o. female  Chief Complaint  Patient presents with  . Urinary Tract Infection    pt states she has pressure, pain, and burning. States it was very bad this morning to the point that she was shaking   URINARY SYMPTOMS Duration- Sunday Dysuria: yes Urinary frequency: yes Urgency: no Small volume voids: yes Symptom severity: severe Urinary incontinence: yes Foul odor: yes Hematuria: no Abdominal pain: no Back pain: yes Suprapubic pain/pressure: yes Flank pain: no Fever:  no Vomiting: yes Relief with cranberry juice: no Relief with pyridium: yes Status: worse Previous urinary tract infection: yes Recurrent urinary tract infection: yes  HYPERTENSION / HYPERLIPIDEMIA- has been up for a couple of months.  Satisfied with current treatment? no Duration of hypertension: chronic BP monitoring frequency: a few times a month BP range: 140s-150s BP medication side effects: no Duration of hyperlipidemia: chronic Cholesterol medication side effects: no Cholesterol supplements: none Past cholesterol medications: none Medication compliance: excellent compliance Aspirin: no Recent stressors: no Recurrent headaches: no Visual changes: no Palpitations: no Dyspnea: no Chest pain: no Lower extremity edema: no Dizzy/lightheaded: no  DIABETES- type 1, saw the endocrinologist 2 weeks ago Hypoglycemic episodes:no Polydipsia/polyuria: yes Visual disturbance: no Chest pain: no Paresthesias: no Glucose Monitoring: yes Taking Insulin?: yes Retinal Examination: Up to Date  Relevant past medical, surgical, family and social history reviewed and updated as indicated. Interim medical history since  our last visit reviewed. Allergies and medications reviewed and updated.  Review of Systems  Constitutional: Negative.   Respiratory: Negative.   Cardiovascular: Negative.   Gastrointestinal: Negative.   Genitourinary: Negative.   Musculoskeletal: Negative.   Psychiatric/Behavioral: Negative.     Per HPI unless specifically indicated above     Objective:    BP 150/83 mmHg  Pulse 60  Temp(Src) 98.7 F (37.1 C)  Ht 5' 8.7" (1.745 m)  Wt 232 lb 9.6 oz (105.507 kg)  BMI 34.65 kg/m2  SpO2 100%  Wt Readings from Last 3 Encounters:  12/10/14 232 lb 9.6 oz (105.507 kg)  08/25/14 225 lb 11.2 oz (102.377 kg)  06/05/14 229 lb (103.874 kg)    Physical Exam  Constitutional: She is oriented to person, place, and time. She appears well-developed and well-nourished. No distress.  HENT:  Head: Normocephalic and atraumatic.  Right Ear: Hearing normal.  Left Ear: Hearing normal.  Nose: Nose normal.  Eyes: Conjunctivae and lids are normal. Right eye exhibits no discharge. Left eye exhibits no discharge. No scleral icterus.  Cardiovascular: Normal rate, regular rhythm, normal heart sounds and intact distal pulses.  Exam reveals no gallop and no friction rub.   No murmur heard. Pulmonary/Chest: Effort normal and breath sounds normal. No respiratory distress. She has no wheezes. She has no rales. She exhibits no tenderness.  Abdominal: Soft. Bowel sounds are normal. She exhibits no distension and no mass. There is tenderness in the suprapubic area. There is no rebound, no guarding and no CVA tenderness.  Musculoskeletal: Normal range of motion.  Neurological: She is alert and oriented to person, place, and time.  Skin: Skin is warm, dry and intact. No rash noted. No erythema. No pallor.  Psychiatric: She has a normal mood and affect. Her speech is normal and behavior is normal. Judgment and thought content normal. Cognition and memory are normal.  Nursing note and vitals  reviewed.   Results for orders placed or performed during the hospital encounter of 08/25/14  C difficile quick scan w PCR reflex ([facility] only)  Result Value Ref Range   C Diff antigen NEGATIVE    C Diff toxin NEGATIVE    C Diff interpretation Negative for C. difficile   Stool culture  Result Value Ref Range   Specimen Description STOOL    Special Requests Normal    Culture      NO SALMONELLA OR SHIGELLA ISOLATED REDUCED NORMAL FECAL FLORA CAMPYLOBACTER DETECTED Results Called to: BRENDA BECKER AT 1610 08/27/14 CTJ FAXED TO [place] DEPT. No Pathogenic E. coli detected    Report Status 08/29/2014 FINAL   CBC WITH DIFFERENTIAL  Result Value Ref Range   WBC 18.5 (H) 3.6 - 11.0 K/uL   RBC 4.96 3.80 - 5.20 MIL/uL   Hemoglobin 14.4 12.0 - 16.0 g/dL   HCT 16.1 09.6 - 04.5 %   MCV 88.3 80.0 - 100.0 fL   MCH 29.0 26.0 - 34.0 pg   MCHC 32.8 32.0 - 36.0 g/dL   RDW 40.9 81.1 - 91.4 %   Platelets 216 150 - 440 K/uL   Neutrophils Relative % 87 %   Neutro Abs 16.0 (H) 1.4 - 6.5 K/uL   Lymphocytes Relative 9 %   Lymphs Abs 1.6 1.0 - 3.6 K/uL   Monocytes Relative 4 %   Monocytes Absolute 0.8 0.2 - 0.9 K/uL   Eosinophils Relative 0 %   Eosinophils Absolute 0.0 0 - 0.7 K/uL   Basophils Relative 0 %   Basophils Absolute 0.0 0 - 0.1 K/uL  Comprehensive metabolic panel  Result Value Ref Range   Sodium 136 135 - 145 mmol/L   Potassium 4.0 3.5 - 5.1 mmol/L   Chloride 105 101 - 111 mmol/L   CO2 22 22 - 32 mmol/L   Glucose, Bld 169 (H) 65 - 99 mg/dL   BUN 10 6 - 20 mg/dL   Creatinine, Ser 7.82 0.44 - 1.00 mg/dL   Calcium 9.2 8.9 - 95.6 mg/dL   Total Protein 7.4 6.5 - 8.1 g/dL   Albumin 4.0 3.5 - 5.0 g/dL   AST 23 15 - 41 U/L   ALT 21 14 - 54 U/L   Alkaline Phosphatase 76 38 - 126 U/L   Total Bilirubin 0.3 0.3 - 1.2 mg/dL   GFR calc non Af Amer >60 >60 mL/min   GFR calc Af Amer >60 >60 mL/min   Anion gap 9 5 - 15  Lipase, blood  Result Value Ref Range   Lipase 54 (H)  22 - 51 U/L  Urinalysis complete, with microscopic ([facility] only)  Result Value Ref Range   Color, Urine AMBER (A) YELLOW   APPearance CLEAR (A) CLEAR   Glucose, UA NEGATIVE NEGATIVE mg/dL   Bilirubin Urine NEGATIVE NEGATIVE   Ketones, ur TRACE (A) NEGATIVE mg/dL   Specific Gravity, Urine 1.020 1.005 - 1.030   Hgb urine dipstick 2+ (A) NEGATIVE   pH 5.0 5.0 - 8.0   Protein, ur NEGATIVE NEGATIVE mg/dL   Nitrite NEGATIVE NEGATIVE   Leukocytes, UA NEGATIVE NEGATIVE   RBC / HPF 6-30 0 - 5 RBC/hpf   WBC, UA 0-5 0 - 5 WBC/hpf   Bacteria, UA NONE SEEN NONE SEEN  Squamous Epithelial / LPF 0-5 (A) NONE SEEN   Mucous PRESENT    Hyaline Casts, UA PRESENT   Protime-INR  Result Value Ref Range   Prothrombin Time 12.7 11.4 - 15.0 seconds   INR 0.93   Hemoglobin A1c  Result Value Ref Range   Hgb A1c MFr Bld 7.1 (H) 4.0 - 6.0 %  Basic metabolic panel  Result Value Ref Range   Sodium 138 135 - 145 mmol/L   Potassium 3.7 3.5 - 5.1 mmol/L   Chloride 107 101 - 111 mmol/L   CO2 22 22 - 32 mmol/L   Glucose, Bld 185 (H) 65 - 99 mg/dL   BUN 6 6 - 20 mg/dL   Creatinine, Ser 0.98 0.44 - 1.00 mg/dL   Calcium 8.6 (L) 8.9 - 10.3 mg/dL   GFR calc non Af Amer >60 >60 mL/min   GFR calc Af Amer >60 >60 mL/min   Anion gap 9 5 - 15  CBC  Result Value Ref Range   WBC 13.1 (H) 3.6 - 11.0 K/uL   RBC 4.36 3.80 - 5.20 MIL/uL   Hemoglobin 12.6 12.0 - 16.0 g/dL   HCT 11.9 14.7 - 82.9 %   MCV 88.8 80.0 - 100.0 fL   MCH 28.8 26.0 - 34.0 pg   MCHC 32.4 32.0 - 36.0 g/dL   RDW 56.2 13.0 - 86.5 %   Platelets 193 150 - 440 K/uL  Glucose, capillary  Result Value Ref Range   Glucose-Capillary 210 (H) 65 - 99 mg/dL   Comment 1 Notify RN   Glucose, capillary  Result Value Ref Range   Glucose-Capillary 178 (H) 65 - 99 mg/dL   Comment 1 Notify RN   Glucose, capillary  Result Value Ref Range   Glucose-Capillary 101 (H) 65 - 99 mg/dL   Comment 1 Notify RN   CBC  Result Value Ref Range   WBC 13.9 (H) 3.6 - 11.0  K/uL   RBC 4.43 3.80 - 5.20 MIL/uL   Hemoglobin 12.9 12.0 - 16.0 g/dL   HCT 78.4 69.6 - 29.5 %   MCV 88.1 80.0 - 100.0 fL   MCH 29.1 26.0 - 34.0 pg   MCHC 33.0 32.0 - 36.0 g/dL   RDW 28.4 (H) 13.2 - 44.0 %   Platelets 184 150 - 440 K/uL  Glucose, capillary  Result Value Ref Range   Glucose-Capillary 185 (H) 65 - 99 mg/dL   Comment 1 Notify RN   Glucose, capillary  Result Value Ref Range   Glucose-Capillary 172 (H) 65 - 99 mg/dL   Comment 1 Notify RN   Glucose, capillary  Result Value Ref Range   Glucose-Capillary 64 (L) 65 - 99 mg/dL   Comment 1 Notify RN   Glucose, capillary  Result Value Ref Range   Glucose-Capillary 118 (H) 65 - 99 mg/dL   Comment 1 Notify RN   Glucose, capillary  Result Value Ref Range   Glucose-Capillary 95 65 - 99 mg/dL   Comment 1 Notify RN       Assessment & Plan:   Problem List Items Addressed This Visit      Cardiovascular and Mediastinum   CAD (coronary artery disease)    Keep BP, DM and cholesterol under control. Checking labs today. Continue to monitor.       Relevant Medications   quinapril (ACCUPRIL) 10 MG tablet   Other Relevant Orders   Basic metabolic panel   HTN (hypertension)    Has been elevated. Will  increase her quinapril to  daily and recheck in 1 month. CMP and microalbumin checked today.      Relevant Medications   quinapril (ACCUPRIL) 10 MG tablet     Endocrine   Type 1 diabetes (HCC)    Doing much better since being on her pump, Continue to follow with endocrinology. Continue current regimen. A1c checked 2 weeks ago      Relevant Medications   Insulin Human (INSULIN PUMP) SOLN   quinapril (ACCUPRIL) 10 MG tablet   Hypoglycemia associated with diabetes (HCC)    Doing much better since being on her pump, Continue to follow with endocrinology. Continue current regimen. A1c checked 2 weeks ago      Relevant Medications   Insulin Human (INSULIN PUMP) SOLN   quinapril (ACCUPRIL) 10 MG tablet     Other    Insomnia   Current use of insulin (HCC)   HLD (hyperlipidemia)    Checking cholestrol today. On crestor. Await results.       Relevant Medications   quinapril (ACCUPRIL) 10 MG tablet   Leukocytosis    Checking CBC today.       Other Visit Diagnoses    Acute cystitis with hematuria    -  Primary    Will treat with cipro x 7 days. If not better call. Phenergyn for nausea. Recheck for resolution of hematuria next visit.     Dysuria        Will check UA today    Hyperlipidemia        Relevant Medications    quinapril (ACCUPRIL) 10 MG tablet    Hematuria        Relevant Orders    UA/M w/rflx Culture, Routine        Follow up plan: Return in about 4 weeks (around 01/07/2015) for Followup BP.

## 2014-12-10 NOTE — Assessment & Plan Note (Signed)
Checking cholestrol today. On crestor. Await results.

## 2014-12-10 NOTE — Assessment & Plan Note (Signed)
Doing much better since being on her pump, Continue to follow with endocrinology. Continue current regimen. A1c checked 2 weeks ago

## 2014-12-10 NOTE — Assessment & Plan Note (Signed)
Keep BP, DM and cholesterol under control. Checking labs today. Continue to monitor.

## 2014-12-11 ENCOUNTER — Encounter: Payer: Self-pay | Admitting: Family Medicine

## 2014-12-11 LAB — COMPREHENSIVE METABOLIC PANEL
ALK PHOS: 81 IU/L (ref 39–117)
ALT: 17 IU/L (ref 0–32)
AST: 17 IU/L (ref 0–40)
Albumin/Globulin Ratio: 1.5 (ref 1.1–2.5)
Albumin: 4.1 g/dL (ref 3.5–5.5)
BILIRUBIN TOTAL: 0.2 mg/dL (ref 0.0–1.2)
BUN/Creatinine Ratio: 12 (ref 9–23)
BUN: 8 mg/dL (ref 6–24)
CHLORIDE: 99 mmol/L (ref 97–106)
CO2: 25 mmol/L (ref 18–29)
Calcium: 9.4 mg/dL (ref 8.7–10.2)
Creatinine, Ser: 0.65 mg/dL (ref 0.57–1.00)
GFR calc Af Amer: 122 mL/min/{1.73_m2} (ref >59)
GFR calc non Af Amer: 106 mL/min/{1.73_m2} (ref >59)
Globulin, Total: 2.8 g/dL (ref 1.5–4.5)
Glucose: 148 mg/dL — ABNORMAL HIGH (ref 65–99)
Potassium: 4.7 mmol/L (ref 3.5–5.2)
SODIUM: 138 mmol/L (ref 136–144)
Total Protein: 6.9 g/dL (ref 6.0–8.5)

## 2014-12-11 LAB — TSH: TSH: 0.887 u[IU]/mL (ref 0.450–4.500)

## 2014-12-11 LAB — CBC WITH DIFFERENTIAL/PLATELET
Basophils Absolute: 0 10*3/uL (ref 0.0–0.2)
Basos: 0 %
EOS (ABSOLUTE): 0.2 10*3/uL (ref 0.0–0.4)
EOS: 2 %
Hematocrit: 41.7 % (ref 34.0–46.6)
Hemoglobin: 13.9 g/dL (ref 11.1–15.9)
IMMATURE GRANS (ABS): 0 10*3/uL (ref 0.0–0.1)
IMMATURE GRANULOCYTES: 0 %
LYMPHS: 30 %
Lymphocytes Absolute: 2.9 10*3/uL (ref 0.7–3.1)
MCH: 29.9 pg (ref 26.6–33.0)
MCHC: 33.3 g/dL (ref 31.5–35.7)
MCV: 90 fL (ref 79–97)
Monocytes Absolute: 0.5 10*3/uL (ref 0.1–0.9)
Monocytes: 5 %
NEUTROS ABS: 6.1 10*3/uL (ref 1.4–7.0)
Neutrophils: 63 %
PLATELETS: 256 10*3/uL (ref 150–379)
RBC: 4.65 x10E6/uL (ref 3.77–5.28)
RDW: 14.3 % (ref 12.3–15.4)
WBC: 9.8 10*3/uL (ref 3.4–10.8)

## 2014-12-11 LAB — LIPID PANEL W/O CHOL/HDL RATIO
Cholesterol, Total: 149 mg/dL (ref 100–199)
HDL: 43 mg/dL (ref >39)
LDL Calculated: 78 mg/dL (ref 0–99)
Triglycerides: 138 mg/dL (ref 0–149)
VLDL Cholesterol Cal: 28 mg/dL (ref 5–40)

## 2014-12-12 LAB — UA/M W/RFLX CULTURE, ROUTINE

## 2014-12-23 ENCOUNTER — Telehealth: Payer: Self-pay | Admitting: Family Medicine

## 2014-12-23 NOTE — Telephone Encounter (Signed)
Appointment scheduled for tomorrow.

## 2014-12-23 NOTE — Telephone Encounter (Signed)
Pt feels as if bp medication isn't working and would like to know if she needs to increase her dosage.

## 2014-12-24 ENCOUNTER — Encounter: Payer: Self-pay | Admitting: Family Medicine

## 2014-12-24 ENCOUNTER — Ambulatory Visit (INDEPENDENT_AMBULATORY_CARE_PROVIDER_SITE_OTHER): Payer: BLUE CROSS/BLUE SHIELD | Admitting: Family Medicine

## 2014-12-24 VITALS — BP 156/80 | HR 87 | Temp 98.6°F | Ht 67.1 in | Wt 231.0 lb

## 2014-12-24 DIAGNOSIS — R300 Dysuria: Secondary | ICD-10-CM | POA: Diagnosis not present

## 2014-12-24 DIAGNOSIS — I10 Essential (primary) hypertension: Secondary | ICD-10-CM

## 2014-12-24 DIAGNOSIS — Z720 Tobacco use: Secondary | ICD-10-CM

## 2014-12-24 MED ORDER — BUPROPION HCL 100 MG PO TABS: ORAL_TABLET | ORAL | Status: DC

## 2014-12-24 MED ORDER — QUINAPRIL HCL 20 MG PO TABS
30.0000 mg | ORAL_TABLET | Freq: Every day | ORAL | Status: DC
Start: 2014-12-24 — End: 2015-01-11

## 2014-12-24 NOTE — Assessment & Plan Note (Signed)
Wants to try wellbutrin. Rx sent to her pharmacy. Will check in in 2 weeks to see how she's doing.

## 2014-12-24 NOTE — Assessment & Plan Note (Signed)
Will increase quinipril to 30mg  daily and recheck in 2 weeks. Cut back to 20mg  if feeling dizzy. Continue to monitor.

## 2014-12-24 NOTE — Progress Notes (Signed)
BP 156/80 mmHg  Pulse 87  Temp(Src) 98.6 F (37 C)  Ht 5' 7.1" (1.704 m)  Wt 231 lb (104.781 kg)  BMI 36.09 kg/m2  SpO2 100%  LMP 12/01/2014 (Exact Date)   Subjective:    Patient ID: Brittany Zavala, female    DOB: 1965-05-08, 49 y.o.   MRN: [id number]  HPI: Brittany Zavala is a 49 y.o. female  Chief Complaint  Patient presents with  . Hypertension    Patient states that he BP has been elevated, she for the last two nights she has taken 2 Quinapril   HYPERTENSION- has been taking 20mg  of her quinipril Hypertension status: exacerbated  Satisfied with current treatment? no Duration of hypertension: chronic BP monitoring frequency:  a few times a day BP range: 120s-180s systolic BP medication side effects:  no Medication compliance: excellent compliance Aspirin: no Recurrent headaches: no Visual changes: no Palpitations: no Dyspnea: no Chest pain: no Lower extremity edema: no Dizzy/lightheaded: no   URINARY SYMPTOMS Duration- Couple of days Dysuria: yes Urinary frequency: yes Urgency: yes Small volume voids: no Symptom severity: mild Urinary incontinence: no Foul odor: no Hematuria: no Abdominal pain: no Back pain: no Suprapubic pain/pressure: no Flank pain: no Fever:  no Vomiting: no  SMOKING CESSATION Smoking: cigarettes Smoking Amount: 1ppd Smoking Onset: as a teenager Smoking Quit Date: Couple of weeks Smoking triggers: stress, alcohol, driving Type of tobacco use: cigarettes Children in the house: yes Other household members who smoke: yes Treatments attempted: cold [place], wellbutrin a long time ago, chantix Pneumovax: Refused   Relevant past medical, surgical, family and social history reviewed and updated as indicated. Interim medical history since our last visit reviewed. Allergies and medications reviewed and updated.  Review of Systems  Constitutional: Negative.   Respiratory: Negative.   Cardiovascular: Negative.    Gastrointestinal: Negative.   Genitourinary: Negative.   Psychiatric/Behavioral: Negative.     Per HPI unless specifically indicated above     Objective:    BP 156/80 mmHg  Pulse 87  Temp(Src) 98.6 F (37 C)  Ht 5' 7.1" (1.704 m)  Wt 231 lb (104.781 kg)  BMI 36.09 kg/m2  SpO2 100%  LMP 12/01/2014 (Exact Date)  Wt Readings from Last 3 Encounters:  12/24/14 231 lb (104.781 kg)  12/10/14 232 lb 9.6 oz (105.507 kg)  08/25/14 225 lb 11.2 oz (102.377 kg)    Physical Exam  Constitutional: She is oriented to person, place, and time. She appears well-developed and well-nourished. No distress.  HENT:  Head: Normocephalic and atraumatic.  Right Ear: Hearing normal.  Left Ear: Hearing normal.  Nose: Nose normal.  Eyes: Conjunctivae and lids are normal. Right eye exhibits no discharge. Left eye exhibits no discharge. No scleral icterus.  Cardiovascular: Normal rate, regular rhythm, normal heart sounds and intact distal pulses.  Exam reveals no gallop and no friction rub.   No murmur heard. Pulmonary/Chest: Effort normal and breath sounds normal. No respiratory distress. She has no wheezes. She has no rales. She exhibits no tenderness.  Musculoskeletal: Normal range of motion.  Neurological: She is alert and oriented to person, place, and time.  Skin: Skin is warm, dry and intact. No rash noted. No erythema. No pallor.  Psychiatric: She has a normal mood and affect. Her speech is normal and behavior is normal. Judgment and thought content normal. Cognition and memory are normal.  Nursing note and vitals reviewed.   Results for orders placed or performed in visit on 12/11/14  [facility] DIABETES  EYE EXAM  Result Value Ref Range   HM Diabetic Eye Exam No Retinopathy No Retinopathy      Assessment & Plan:   Problem List Items Addressed This Visit      Cardiovascular and Mediastinum   HTN (hypertension) - Primary    Will increase quinipril to  daily and recheck in 2 weeks. Cut back  to  if feeling dizzy. Continue to monitor.       Relevant Medications   quinapril (ACCUPRIL) 20 MG tablet     Other   Tobacco abuse    Wants to try wellbutrin. Rx sent to her pharmacy. Will check in in 2 weeks to see how she's doing.        Other Visit Diagnoses    Dysuria        Will check another UA to see if UTI again. Will get culture if dirty to pick the right antibiotic.     Relevant Orders    UA/M w/rflx Culture, Routine        Follow up plan: Return 2 weeks, for BP and smoking follow up.

## 2014-12-25 ENCOUNTER — Other Ambulatory Visit: Payer: Self-pay | Admitting: Family Medicine

## 2014-12-28 NOTE — Telephone Encounter (Signed)
Your patient.  Thanks 

## 2014-12-29 NOTE — Telephone Encounter (Signed)
She appears to be seeing Dr. Laural BenesJohnson now; Dr. Dossie Arbourrissman listed as primary; will forward to Dr. Laural BenesJohnson

## 2015-01-01 ENCOUNTER — Ambulatory Visit: Payer: BLUE CROSS/BLUE SHIELD | Admitting: Family Medicine

## 2015-01-04 ENCOUNTER — Ambulatory Visit (INDEPENDENT_AMBULATORY_CARE_PROVIDER_SITE_OTHER): Payer: BLUE CROSS/BLUE SHIELD | Admitting: Family Medicine

## 2015-01-04 ENCOUNTER — Encounter: Payer: Self-pay | Admitting: Family Medicine

## 2015-01-04 VITALS — BP 162/80 | HR 64 | Temp 99.3°F | Ht 67.1 in | Wt 234.5 lb

## 2015-01-04 DIAGNOSIS — I25119 Atherosclerotic heart disease of native coronary artery with unspecified angina pectoris: Secondary | ICD-10-CM | POA: Diagnosis not present

## 2015-01-04 DIAGNOSIS — Z720 Tobacco use: Secondary | ICD-10-CM | POA: Diagnosis not present

## 2015-01-04 DIAGNOSIS — I10 Essential (primary) hypertension: Secondary | ICD-10-CM | POA: Diagnosis not present

## 2015-01-04 DIAGNOSIS — R062 Wheezing: Secondary | ICD-10-CM

## 2015-01-04 MED ORDER — NICOTINE 10 MG IN INHA: 1.0000 | RESPIRATORY_TRACT | Status: DC | PRN

## 2015-01-04 MED ORDER — ALBUTEROL SULFATE (2.5 MG/3ML) 0.083% IN NEBU: 2.5000 mg | INHALATION_SOLUTION | Freq: Once | RESPIRATORY_TRACT | Status: DC

## 2015-01-04 MED ORDER — ALBUTEROL SULFATE (2.5 MG/3ML) 0.083% IN NEBU: 2.5000 mg | INHALATION_SOLUTION | Freq: Four times a day (QID) | RESPIRATORY_TRACT | Status: DC | PRN

## 2015-01-04 NOTE — Assessment & Plan Note (Signed)
Unable to tolerate wellbutrin- made BP go up. Will stop it. Will start nicotrol inhaler.

## 2015-01-04 NOTE — Progress Notes (Signed)
BP 162/80 mmHg  Pulse 64  Temp(Src) 99.3 F (37.4 C)  Ht 5' 7.1" (1.704 m)  Wt 234 lb 8 oz (106.369 kg)  BMI 36.63 kg/m2  LMP 12/01/2014 (Exact Date)   Subjective:    Patient ID: Brittany Zavala, female    DOB: September 01, 1965, 49 y.o.   MRN: [id number]  HPI: Brittany Zavala is a 49 y.o. female  Chief Complaint  Patient presents with  . Hypertension   HYPERTENSION- felt good for 5 days, then BP went up a whole lot, around the time that she was going up on her wellbutrin. Has been taking more of her NSAIDs Hypertension status: uncontrolled  Satisfied with current treatment? no Duration of hypertension: chronic BP monitoring frequency:  a few times a day BP range: 130s-180s BP medication side effects:  no Medication compliance: excellent compliance Aspirin: no Recurrent headaches: yes Visual changes: yes a little blurry Palpitations: no Dyspnea: yes Chest pain: no Lower extremity edema: no Dizzy/lightheaded: yes  Relevant past medical, surgical, family and social history reviewed and updated as indicated. Interim medical history since our last visit reviewed. Allergies and medications reviewed and updated.  Review of Systems  Constitutional: Negative.   Respiratory: Negative.   Cardiovascular: Negative.   Psychiatric/Behavioral: Negative.    Per HPI unless specifically indicated above     Objective:    BP 162/80 mmHg  Pulse 64  Temp(Src) 99.3 F (37.4 C)  Ht 5' 7.1" (1.704 m)  Wt 234 lb 8 oz (106.369 kg)  BMI 36.63 kg/m2  LMP 12/01/2014 (Exact Date)  Wt Readings from Last 3 Encounters:  01/04/15 234 lb 8 oz (106.369 kg)  12/24/14 231 lb (104.781 kg)  12/10/14 232 lb 9.6 oz (105.507 kg)    Physical Exam  Constitutional: She is oriented to person, place, and time. She appears well-developed and well-nourished. No distress.  HENT:  Head: Normocephalic and atraumatic.  Right Ear: Hearing normal.  Left Ear: Hearing normal.  Nose: Nose  normal.  Eyes: Conjunctivae and lids are normal. Right eye exhibits no discharge. Left eye exhibits no discharge. No scleral icterus.  Cardiovascular: Normal rate, regular rhythm, normal heart sounds and intact distal pulses.  Exam reveals no gallop and no friction rub.   No murmur heard. Pulmonary/Chest: Effort normal. No respiratory distress. She has no decreased breath sounds. She has wheezes in the left upper field, the left middle field and the left lower field. She has rhonchi in the left lower field. She has no rales. She exhibits no tenderness.  Musculoskeletal: Normal range of motion.  Neurological: She is alert and oriented to person, place, and time.  Skin: Skin is warm, dry and intact. No rash noted. No erythema. No pallor.  Psychiatric: She has a normal mood and affect. Her speech is normal and behavior is normal. Judgment and thought content normal. Cognition and memory are normal.  Nursing note and vitals reviewed.   Results for orders placed or performed in visit on 12/11/14  HM DIABETES EYE EXAM  Result Value Ref Range   HM Diabetic Eye Exam No Retinopathy No Retinopathy      Assessment & Plan:   Problem List Items Addressed This Visit      Cardiovascular and Mediastinum   CAD (coronary artery disease)    Feeling SOB and having usual CP. EKG checked today.       Relevant Orders   EKG 12-Lead (Completed)   HTN (hypertension) - Primary    Poorly controlled, likely due  to the wellbutrin. Will stop it. Continue current regimen. Recheck 1 week.         Other   Tobacco abuse    Unable to tolerate wellbutrin- made BP go up. Will stop it. Will start nicotrol inhaler.       Relevant Medications   nicotine (NICOTROL) 10 MG inhaler    Other Visit Diagnoses    Wheezing        Neb done today, lungs clear after neb. Nebs at home. Continue to monitor. Let us know if not feeling better or feeling worse.     Relevant Medications    albuterol (PROVENTIL) (2.5 MG/3ML)  0.083% nebulizer solution 2.5 mg        Follow up plan: Return in about 1 week (around 01/11/2015) for BP check.

## 2015-01-04 NOTE — Assessment & Plan Note (Signed)
Feeling SOB and having usual CP. EKG checked today.

## 2015-01-04 NOTE — Assessment & Plan Note (Signed)
Poorly controlled, likely due to the wellbutrin. Will stop it. Continue current regimen. Recheck 1 week.

## 2015-01-04 NOTE — Patient Instructions (Signed)
1 welbutrin qAM x 3 days, then stop Continue 30mg  of quinipril Recheck with us in 1 week

## 2015-01-06 ENCOUNTER — Ambulatory Visit: Payer: BLUE CROSS/BLUE SHIELD | Admitting: Family Medicine

## 2015-01-08 ENCOUNTER — Emergency Department: Payer: BLUE CROSS/BLUE SHIELD

## 2015-01-08 ENCOUNTER — Emergency Department
Admission: EM | Admit: 2015-01-08 | Discharge: 2015-01-08 | Disposition: A | Discharge status: Home / Self Care | Payer: BLUE CROSS/BLUE SHIELD | Attending: Emergency Medicine | Admitting: Emergency Medicine

## 2015-01-08 DIAGNOSIS — F17210 Nicotine dependence, cigarettes, uncomplicated: Secondary | ICD-10-CM | POA: Diagnosis not present

## 2015-01-08 DIAGNOSIS — R079 Chest pain, unspecified: Secondary | ICD-10-CM | POA: Diagnosis present

## 2015-01-08 DIAGNOSIS — Z794 Long term (current) use of insulin: Secondary | ICD-10-CM | POA: Diagnosis not present

## 2015-01-08 DIAGNOSIS — I10 Essential (primary) hypertension: Secondary | ICD-10-CM | POA: Diagnosis not present

## 2015-01-08 DIAGNOSIS — J40 Bronchitis, not specified as acute or chronic: Secondary | ICD-10-CM | POA: Insufficient documentation

## 2015-01-08 DIAGNOSIS — R609 Edema, unspecified: Secondary | ICD-10-CM | POA: Insufficient documentation

## 2015-01-08 DIAGNOSIS — E10649 Type 1 diabetes mellitus with hypoglycemia without coma: Secondary | ICD-10-CM | POA: Insufficient documentation

## 2015-01-08 DIAGNOSIS — Z79899 Other long term (current) drug therapy: Secondary | ICD-10-CM | POA: Diagnosis not present

## 2015-01-08 DIAGNOSIS — J9801 Acute bronchospasm: Secondary | ICD-10-CM

## 2015-01-08 DIAGNOSIS — R911 Solitary pulmonary nodule: Secondary | ICD-10-CM

## 2015-01-08 LAB — CBC
HCT: 38.9 % (ref 35.0–47.0)
Hemoglobin: 12.8 g/dL (ref 12.0–16.0)
MCH: 29.3 pg (ref 26.0–34.0)
MCHC: 32.9 g/dL (ref 32.0–36.0)
MCV: 89 fL (ref 80.0–100.0)
PLATELETS: 206 10*3/uL (ref 150–440)
RBC: 4.37 MIL/uL (ref 3.80–5.20)
RDW: 14.1 % (ref 11.5–14.5)
WBC: 8.9 10*3/uL (ref 3.6–11.0)

## 2015-01-08 LAB — COMPREHENSIVE METABOLIC PANEL
ALT: 33 U/L (ref 14–54)
ANION GAP: 5 (ref 5–15)
AST: 34 U/L (ref 15–41)
Albumin: 3.6 g/dL (ref 3.5–5.0)
Alkaline Phosphatase: 81 U/L (ref 38–126)
BUN: 10 mg/dL (ref 6–20)
CALCIUM: 8.7 mg/dL — AB (ref 8.9–10.3)
CHLORIDE: 106 mmol/L (ref 101–111)
CO2: 23 mmol/L (ref 22–32)
CREATININE: 0.59 mg/dL (ref 0.44–1.00)
Glucose, Bld: 330 mg/dL — ABNORMAL HIGH (ref 65–99)
Potassium: 4.4 mmol/L (ref 3.5–5.1)
Sodium: 134 mmol/L — ABNORMAL LOW (ref 135–145)
Total Bilirubin: 0.4 mg/dL (ref 0.3–1.2)
Total Protein: 6.9 g/dL (ref 6.5–8.1)

## 2015-01-08 LAB — TROPONIN I

## 2015-01-08 MED ORDER — IPRATROPIUM-ALBUTEROL 0.5-2.5 (3) MG/3ML IN SOLN
3.0000 mL | Freq: Once | RESPIRATORY_TRACT | Status: AC
  Administered 2015-01-08: 3 mL via RESPIRATORY_TRACT
  Filled 2015-01-08: qty 3

## 2015-01-08 MED ORDER — PREDNISONE 20 MG PO TABS
60.0000 mg | ORAL_TABLET | Freq: Once | ORAL | Status: AC
  Administered 2015-01-08: 60 mg via ORAL
  Filled 2015-01-08: qty 3

## 2015-01-08 MED ORDER — IOHEXOL 350 MG/ML SOLN
75.0000 mL | Freq: Once | INTRAVENOUS | Status: AC | PRN
  Administered 2015-01-08: 75 mL via INTRAVENOUS

## 2015-01-08 MED ORDER — PREDNISONE 10 MG PO TABS: 50.0000 mg | ORAL_TABLET | Freq: Every day | ORAL | Status: DC

## 2015-01-08 NOTE — Discharge Instructions (Signed)
You were evaluated for shortness of breath and chest discomfort, and your exam and evaluation are reassuring in the emergency department. I suspect your symptoms are coming from bronchospasm also called wheezing which may have been brought on by virus such as bronchitis, or outdoor allergens such as the wild fire smog, or smoking.  You're being started on prednisone to help with lung inflammation. Continue your albuterol at home every 4 hours as needed for wheezing or shortness of breath.  Return to the emergency room for any fever, new or worsening trouble breathing, shortness of breath, chest pain, sweating, or any other symptoms concerning to you.  2 lung nodules were seen in the right side of the lower area of the right lung. Due to risk factor of smoking, you should have a repeat CT in 3-6 months, your primary care physician can order this.   Bronchospasm, Adult A bronchospasm is a spasm or tightening of the airways going into the lungs. During a bronchospasm breathing becomes more difficult because the airways get smaller. When this happens there can be coughing, a whistling sound when breathing (wheezing), and difficulty breathing. Bronchospasm is often associated with asthma, but not all patients who experience a bronchospasm have asthma. CAUSES  A bronchospasm is caused by inflammation or irritation of the airways. The inflammation or irritation may be triggered by:   Allergies (such as to animals, pollen, food, or mold). Allergens that cause bronchospasm may cause wheezing immediately after exposure or many hours later.   Infection. Viral infections are believed to be the most common cause of bronchospasm.   Exercise.   Irritants (such as pollution, cigarette smoke, strong odors, aerosol sprays, and paint fumes).   Weather changes. Winds increase molds and pollens in the air. Rain refreshes the air by washing irritants out. Cold air may cause inflammation.   Stress and emotional  upset.  SIGNS AND SYMPTOMS   Wheezing.   Excessive nighttime coughing.   Frequent or severe coughing with a simple cold.   Chest tightness.   Shortness of breath.  DIAGNOSIS  Bronchospasm is usually diagnosed through a history and physical exam. Tests, such as chest X-rays, are sometimes done to look for other conditions. TREATMENT   Inhaled medicines can be given to open up your airways and help you breathe. The medicines can be given using either an inhaler or a nebulizer machine.  Corticosteroid medicines may be given for severe bronchospasm, usually when it is associated with asthma. HOME CARE INSTRUCTIONS   Always have a plan prepared for seeking medical care. Know when to call your health care provider and local emergency services (911 in the [place] Know where you can access local emergency care.  Only take medicines as directed by your health care provider.  If you were prescribed an inhaler or nebulizer machine, ask your health care provider to explain how to use it correctly. Always use a spacer with your inhaler if you were given one.  It is necessary to remain calm during an attack. Try to relax and breathe more slowly.  Control your home environment in the following ways:   Change your heating and air conditioning filter at least once a month.   Limit your use of fireplaces and wood stoves.  Do not smoke and do not allow smoking in your home.   Avoid exposure to perfumes and fragrances.   Get rid of pests (such as roaches and mice) and their droppings.   Throw away plants if you see mold  on them.   Keep your house clean and dust free.   Replace carpet with wood, tile, or vinyl flooring. Carpet can trap dander and dust.   Use allergy-proof pillows, mattress covers, and box spring covers.   Wash bed sheets and blankets every week in hot water and dry them in a dryer.   Use blankets that are made of polyester or cotton.   Wash hands  frequently. SEEK MEDICAL CARE IF:   You have muscle aches.   You have chest pain.   The sputum changes from clear or white to yellow, green, gray, or bloody.   The sputum you cough up gets thicker.   There are problems that may be related to the medicine you are given, such as a rash, itching, swelling, or trouble breathing.  SEEK IMMEDIATE MEDICAL CARE IF:   You have worsening wheezing and coughing even after taking your prescribed medicines.   You have increased difficulty breathing.   You develop severe chest pain. MAKE SURE YOU:   Understand these instructions.  Will watch your condition.  Will get help right away if you are not doing well or get worse.   This information is not intended to replace advice given to you by your health care provider. Make sure you discuss any questions you have with your health care provider.   Document Released: 02/02/2003 Document Revised: 02/20/2014 Document Reviewed: 07/22/2012 Elsevier Interactive Patient Education 2016 [employer]  Nonspecific Chest Pain It is often hard to find the cause of chest pain. There is always a chance that your pain could be related to something serious, such as a heart attack or a blood clot in your lungs. Chest pain can also be caused by conditions that are not life-threatening. If you have chest pain, it is very important to follow up with your doctor.  HOME CARE  If you were prescribed an antibiotic medicine, finish it all even if you start to feel better.  Avoid any activities that cause chest pain.  Do not use any tobacco products, including cigarettes, chewing tobacco, or electronic cigarettes. If you need help quitting, ask your doctor.  Do not drink alcohol.  Take medicines only as told by your doctor.  Keep all follow-up visits as told by your doctor. This is important. This includes any further testing if your chest pain does not go away.  Your doctor may tell you to keep your  head raised (elevated) while you sleep.  Make lifestyle changes as told by your doctor. These may include:  Getting regular exercise. Ask your doctor to suggest some activities that are safe for you.  Eating a heart-healthy diet. Your doctor or a diet specialist (dietitian) can help you to learn healthy eating options.  Maintaining a healthy weight.  Managing diabetes, if necessary.  Reducing stress. GET HELP IF:  Your chest pain does not go away, even after treatment.  You have a rash with blisters on your chest.  You have a fever. GET HELP RIGHT AWAY IF:  Your chest pain is worse.  You have an increasing cough, or you cough up blood.  You have severe belly (abdominal) pain.  You feel extremely weak.  You pass out (faint).  You have chills.  You have sudden, unexplained chest discomfort.  You have sudden, unexplained discomfort in your arms, back, neck, or jaw.  You have shortness of breath at any time.  You suddenly start to sweat, or your skin gets clammy.  You feel nauseous.  You vomit.  You suddenly feel light-headed or dizzy.  Your heart begins to beat quickly, or it feels like it is skipping beats. These symptoms may be an emergency. Do not wait to see if the symptoms will go away. Get medical help right away. Call your local emergency services (911 in the [place] Do not drive yourself to the hospital.   This information is not intended to replace advice given to you by your health care provider. Make sure you discuss any questions you have with your health care provider.   Document Released: 07/19/2007 Document Revised: 02/20/2014 Document Reviewed: 09/05/2013 Elsevier Interactive Patient Education [employer]
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
## 2015-01-08 NOTE — ED Notes (Signed)
Pt c/o substernal chest pain that radiates into the back and left shoulder this morning ,...states she was seen by her PCP on Monday with URI sx states she is not sure if the pain is related to the cough or not

## 2015-01-08 NOTE — ED Notes (Signed)
In room to discuss plan of care and delays with patient.  Patient verbalized understanding.  Toileting and food offered to patient at this time.  Patient declined.  Patient appears comfortable and states, "I'm just feeling ready to go home."  MD notified.  Will continue to monitor.

## 2015-01-11 ENCOUNTER — Ambulatory Visit (INDEPENDENT_AMBULATORY_CARE_PROVIDER_SITE_OTHER): Payer: BLUE CROSS/BLUE SHIELD | Admitting: Family Medicine

## 2015-01-11 ENCOUNTER — Encounter: Payer: Self-pay | Admitting: Family Medicine

## 2015-01-11 VITALS — BP 168/80 | HR 80 | Temp 99.4°F | Wt 234.0 lb

## 2015-01-11 DIAGNOSIS — J209 Acute bronchitis, unspecified: Secondary | ICD-10-CM

## 2015-01-11 DIAGNOSIS — E108 Type 1 diabetes mellitus with unspecified complications: Secondary | ICD-10-CM

## 2015-01-11 DIAGNOSIS — Z720 Tobacco use: Secondary | ICD-10-CM

## 2015-01-11 DIAGNOSIS — R918 Other nonspecific abnormal finding of lung field: Secondary | ICD-10-CM | POA: Diagnosis not present

## 2015-01-11 DIAGNOSIS — I10 Essential (primary) hypertension: Secondary | ICD-10-CM | POA: Diagnosis not present

## 2015-01-11 MED ORDER — QUINAPRIL HCL 40 MG PO TABS: 40.0000 mg | ORAL_TABLET | Freq: Every day | ORAL | Status: DC

## 2015-01-11 NOTE — Progress Notes (Signed)
BP 168/80 mmHg  Pulse 80  Temp(Src) 99.4 F (37.4 C)  Wt 234 lb (106.142 kg)  SpO2 98%  LMP 01/07/2015   Subjective:    Patient ID: Brittany Zavala, female    DOB: January 21, 1966, 49 y.o.   MRN: [id number]  HPI: Brittany Zavala is a 49 y.o. female  Chief Complaint  Patient presents with  . Hypertension    1 week fuv   Using the nicitrol, bought a pack of cigarettes today.  Breathing has been better. Is on the prednisone. Only on it for 1 day more, sugars have been in the 200s Had 2 nodules on her CT in the ER. Needs follow up in 3 months.   HYPERTENSION Hypertension status: better  Satisfied with current treatment? no Duration of hypertension: chronic BP monitoring frequency:  not checking BP range: 150s/90 BP medication side effects:  no Medication compliance: excellent compliance Aspirin: yes Recurrent headaches: no Visual changes: no Palpitations: no Dyspnea: no Chest pain: no Lower extremity edema: no Dizzy/lightheaded: no   Relevant past medical, surgical, family and social history reviewed and updated as indicated. Interim medical history since our last visit reviewed. Allergies and medications reviewed and updated.  Review of Systems  Constitutional: Negative.   Respiratory: Negative.   Cardiovascular: Negative.   Endocrine: Negative.   Psychiatric/Behavioral: Negative.     Per HPI unless specifically indicated above     Objective:    BP 168/80 mmHg  Pulse 80  Temp(Src) 99.4 F (37.4 C)  Wt 234 lb (106.142 kg)  SpO2 98%  LMP 01/07/2015  Wt Readings from Last 3 Encounters:  01/11/15 234 lb (106.142 kg)  01/08/15 230 lb (104.327 kg)  01/04/15 234 lb 8 oz (106.369 kg)    Physical Exam  Constitutional: She is oriented to person, place, and time. She appears well-developed and well-nourished. No distress.  HENT:  Head: Normocephalic and atraumatic.  Right Ear: Hearing and external ear normal.  Left Ear: Hearing and external  ear normal.  Nose: Nose normal.  Mouth/Throat: Oropharynx is clear and moist. No oropharyngeal exudate.  Eyes: Conjunctivae, EOM and lids are normal. Pupils are equal, round, and reactive to light. Right eye exhibits no discharge. Left eye exhibits no discharge. No scleral icterus.  Neck: Normal range of motion. Neck supple. No JVD present. No tracheal deviation present. No thyromegaly present.  Cardiovascular: Normal rate, regular rhythm, normal heart sounds and intact distal pulses.  Exam reveals no gallop and no friction rub.   No murmur heard. Pulmonary/Chest: Effort normal. No stridor. No respiratory distress. She has decreased breath sounds in the right upper field, the right middle field, the right lower field, the left upper field, the left middle field and the left lower field. She has no wheezes. She has no rhonchi. She has no rales. She exhibits no tenderness.  Musculoskeletal: Normal range of motion.  Lymphadenopathy:    She has no cervical adenopathy.  Neurological: She is alert and oriented to person, place, and time.  Skin: Skin is warm, dry and intact. No rash noted. She is not diaphoretic. No erythema. No pallor.  Psychiatric: She has a normal mood and affect. Her speech is normal and behavior is normal. Judgment and thought content normal. Cognition and memory are normal.  Nursing note and vitals reviewed.   Results for orders placed or performed during the hospital encounter of 01/08/15  CBC  Result Value Ref Range   WBC 8.9 3.6 - 11.0 K/uL   RBC 4.37  3.80 - 5.20 MIL/uL   Hemoglobin 12.8 12.0 - 16.0 g/dL   HCT 38.9 56.2 - 13.0 %   MCV 89.0 80.0 - 100.0 fL   MCH 29.3 26.0 - 34.0 pg   MCHC 32.9 32.0 - 36.0 g/dL   RDW 86.5 78.4 - 69.6 %   Platelets 206 150 - 440 K/uL  Comprehensive metabolic panel  Result Value Ref Range   Sodium 134 (L) 135 - 145 mmol/L   Potassium 4.4 3.5 - 5.1 mmol/L   Chloride 106 101 - 111 mmol/L   CO2 23 22 - 32 mmol/L   Glucose, Bld 330 (H)  65 - 99 mg/dL   BUN 10 6 - 20 mg/dL   Creatinine, Ser 2.95 0.44 - 1.00 mg/dL   Calcium 8.7 (L) 8.9 - 10.3 mg/dL   Total Protein 6.9 6.5 - 8.1 g/dL   Albumin 3.6 3.5 - 5.0 g/dL   AST 34 15 - 41 U/L   ALT 33 14 - 54 U/L   Alkaline Phosphatase 81 38 - 126 U/L   Total Bilirubin 0.4 0.3 - 1.2 mg/dL   GFR calc non Af Amer >60 >60 mL/min   GFR calc Af Amer >60 >60 mL/min   Anion gap 5 5 - 15  Troponin I  Result Value Ref Range   Troponin I <0.03 <0.031 ng/mL  Troponin I  Result Value Ref Range   Troponin I <0.03 <0.031 ng/mL      Assessment & Plan:   Problem List Items Addressed This Visit      Cardiovascular and Mediastinum   HTN (hypertension) - Primary    Still elevated. Will increase quinapril to  daily. Check back in in 2-3 weeks. If still elevated will add diuretic. Continue to monitor.       Relevant Medications   quinapril (ACCUPRIL) 40 MG tablet     Endocrine   Type 1 diabetes (HCC)    Under better control today. Continue to monitor closely as she comes off the prednisone. Call if elevated again.       Relevant Medications   quinapril (ACCUPRIL) 40 MG tablet     Other   Tobacco abuse    Discussed avoiding cigarettes and increasing her use of the nicitrol inhaler. Continue to monitor.       Pulmonary nodules/lesions, multiple    Needs follow up on lung nodules in 3 months. CT ordered today. Await results.       Relevant Orders   CT CHEST NODULE FOLLOW UP LOW DOSE W/O    Other Visit Diagnoses    Acute bronchitis, unspecified organism        Greatly improved. Finish prednisone. Continue to monitor. Recheck at follow up in 2-3 weeks.         Follow up plan: Re82.9urn 10 days, for BP recheck.

## 2015-01-11 NOTE — Assessment & Plan Note (Signed)
Still elevated. Will increase quinapril to 40mg  daily. Check back in in 2-3 weeks. If still elevated will add diuretic. Continue to monitor.

## 2015-01-11 NOTE — Assessment & Plan Note (Signed)
Needs follow up on lung nodules in 3 months. CT ordered today. Await results.

## 2015-01-11 NOTE — Assessment & Plan Note (Signed)
Discussed avoiding cigarettes and increasing her use of the nicitrol inhaler. Continue to monitor.

## 2015-01-11 NOTE — Assessment & Plan Note (Signed)
Under better control today. Continue to monitor closely as she comes off the prednisone. Call if elevated again.

## 2015-01-21 ENCOUNTER — Ambulatory Visit (INDEPENDENT_AMBULATORY_CARE_PROVIDER_SITE_OTHER): Payer: BLUE CROSS/BLUE SHIELD | Admitting: Family Medicine

## 2015-01-21 ENCOUNTER — Encounter: Payer: Self-pay | Admitting: Family Medicine

## 2015-01-21 ENCOUNTER — Other Ambulatory Visit: Payer: Self-pay | Admitting: Family Medicine

## 2015-01-21 VITALS — BP 149/89 | HR 81 | Temp 98.6°F | Wt 231.0 lb

## 2015-01-21 DIAGNOSIS — I10 Essential (primary) hypertension: Secondary | ICD-10-CM

## 2015-01-21 DIAGNOSIS — Z8489 Family history of other specified conditions: Secondary | ICD-10-CM | POA: Diagnosis not present

## 2015-01-21 MED ORDER — HYDROCHLOROTHIAZIDE 25 MG PO TABS: 25.0000 mg | ORAL_TABLET | Freq: Every day | ORAL | Status: DC

## 2015-01-21 NOTE — Patient Instructions (Addendum)
Black Cohash Evening Primrose Oil  Perimenopause Perimenopause is the time when your body begins to move into the menopause (no menstrual period for 12 straight months). It is a natural process. Perimenopause can begin 2-8 years before the menopause and usually lasts for 1 year after the menopause. During this time, your ovaries may or may not produce an egg. The ovaries vary in their production of estrogen and progesterone hormones each month. This can cause irregular menstrual periods, difficulty getting pregnant, vaginal bleeding between periods, and uncomfortable symptoms. CAUSES  Irregular production of the ovarian hormones, estrogen and progesterone, and not ovulating every month.  Other causes include:  Tumor of the pituitary gland in the brain.  Medical disease that affects the ovaries.  Radiation treatment.  Chemotherapy.  Unknown causes.  Heavy smoking and excessive alcohol intake can bring on perimenopause sooner. SIGNS AND SYMPTOMS   Hot flashes.  Night sweats.  Irregular menstrual periods.  Decreased sex drive.  Vaginal dryness.  Headaches.  Mood swings.  Depression.  Memory problems.  Irritability.  Tiredness.  Weight gain.  Trouble getting pregnant.  The beginning of losing bone cells (osteoporosis).  The beginning of hardening of the arteries (atherosclerosis). DIAGNOSIS  Your health care provider will make a diagnosis by analyzing your age, menstrual history, and symptoms. He or she will do a physical exam and note any changes in your body, especially your female organs. Female hormone tests may or may not be helpful depending on the amount of female hormones you produce and when you produce them. However, other hormone tests may be helpful to rule out other problems. TREATMENT  In some cases, no treatment is needed. The decision on whether treatment is necessary during the perimenopause should be made by you and your health care provider based  on how the symptoms are affecting you and your lifestyle. Various treatments are available, such as:  Treating individual symptoms with a specific medicine for that symptom.  Herbal medicines that can help specific symptoms.  Counseling.  Group therapy. HOME CARE INSTRUCTIONS   Keep track of your menstrual periods (when they occur, how heavy they are, how long between periods, and how long they last) as well as your symptoms and when they started.  Only take over-the-counter or prescription medicines as directed by your health care provider.  Sleep and rest.  Exercise.  Eat a diet that contains calcium (good for your bones) and soy (acts like the estrogen hormone).  Do not smoke.  Avoid alcoholic beverages.  Take vitamin supplements as recommended by your health care provider. Taking vitamin E may help in certain cases.  Take calcium and vitamin D supplements to help prevent bone loss.  Group therapy is sometimes helpful.  Acupuncture may help in some cases. SEEK MEDICAL CARE IF:   You have questions about any symptoms you are having.  You need a referral to a specialist (gynecologist, psychiatrist, or psychologist). SEEK IMMEDIATE MEDICAL CARE IF:   You have vaginal bleeding.  Your period lasts longer than 8 days.  Your periods are recurring sooner than 21 days.  You have bleeding after intercourse.  You have severe depression.  You have pain when you urinate.  You have severe headaches.  You have vision problems.   This information is not intended to replace advice given to you by your health care provider. Make sure you discuss any questions you have with your health care provider.   Document Released: 03/09/2004 Document Revised: 02/20/2014 Document Reviewed: 08/29/2012 Elsevier  Interactive Patient Education 2016 [employer]  

## 2015-01-21 NOTE — Progress Notes (Signed)
BP 149/89 mmHg  Pulse 81  Temp(Src) 98.6 F (37 C)  Wt 231 lb (104.781 kg)  SpO2 99%  LMP 01/07/2015   Subjective:    Patient ID: Brittany Zavala, female    DOB: 1965/12/15, 49 y.o.   MRN: [id number]  HPI: Fahima Cifelli is a 50 y.o. female  Chief Complaint  Patient presents with  . Hypertension    Patient states that her BP has been better than what it was, but still in the 140's/ 80-90's   HYPERTENSION Hypertension status: better  Satisfied with current treatment? no Duration of hypertension: chronic BP monitoring frequency:  a few times a day BP range: 140s-80s-90s BP medication side effects:  yes Medication compliance: excellent compliance Aspirin: no Recurrent headaches: yes Visual changes: no Palpitations: no Dyspnea: no Chest pain: no Lower extremity edema: no Dizzy/lightheaded: yes- when going from bending over and then standing Mom was diagnosed with a pheo in her 63s, she is afraid that she has one, especially with her blood pressure being out of control. She would like to be checked.  Thinks she is going through menopause. Lots of mood swings.   Relevant past medical, surgical, family and social history reviewed and updated as indicated. Interim medical history since our last visit reviewed. Allergies and medications reviewed and updated.  Review of Systems  Constitutional: Negative.   Respiratory: Negative.   Cardiovascular: Negative.   Psychiatric/Behavioral: Negative.     Per HPI unless specifically indicated above     Objective:    BP 149/89 mmHg  Pulse 81  Temp(Src) 98.6 F (37 C)  Wt 231 lb (104.781 kg)  SpO2 99%  LMP 01/07/2015  Wt Readings from Last 3 Encounters:  01/21/15 231 lb (104.781 kg)  01/11/15 234 lb (106.142 kg)  01/08/15 230 lb (104.327 kg)    Physical Exam  Constitutional: She is oriented to person, place, and time. She appears well-developed and well-nourished. No distress.  HENT:  Head:  Normocephalic and atraumatic.  Right Ear: Hearing normal.  Left Ear: Hearing normal.  Nose: Nose normal.  Eyes: Conjunctivae and lids are normal. Right eye exhibits no discharge. Left eye exhibits no discharge. No scleral icterus.  Pulmonary/Chest: Effort normal. No respiratory distress.  Musculoskeletal: Normal range of motion.  Neurological: She is alert and oriented to person, place, and time.  Skin: Skin is intact. No rash noted.  Psychiatric: She has a normal mood and affect. Her speech is normal and behavior is normal. Judgment and thought content normal. Cognition and memory are normal.    Results for orders placed or performed during the hospital encounter of 01/08/15  CBC  Result Value Ref Range   WBC 8.9 3.6 - 11.0 K/uL   RBC 4.37 3.80 - 5.20 MIL/uL   Hemoglobin 12.8 12.0 - 16.0 g/dL   HCT 40.9 81.1 - 91.4 %   MCV 89.0 80.0 - 100.0 fL   MCH 29.3 26.0 - 34.0 pg   MCHC 32.9 32.0 - 36.0 g/dL   RDW 78.2 95.6 - 21.3 %   Platelets 206 150 - 440 K/uL  Comprehensive metabolic panel  Result Value Ref Range   Sodium 134 (L) 135 - 145 mmol/L   Potassium 4.4 3.5 - 5.1 mmol/L   Chloride 106 101 - 111 mmol/L   CO2 23 22 - 32 mmol/L   Glucose, Bld 330 (H) 65 - 99 mg/dL   BUN 10 6 - 20 mg/dL   Creatinine, Ser 0.86 0.44 - 1.00 mg/dL  Calcium 8.7 (L) 8.9 - 10.3 mg/dL   Total Protein 6.9 6.5 - 8.1 g/dL   Albumin 3.6 3.5 - 5.0 g/dL   AST 34 15 - 41 U/L   ALT 33 14 - 54 U/L   Alkaline Phosphatase 81 38 - 126 U/L   Total Bilirubin 0.4 0.3 - 1.2 mg/dL   GFR calc non Af Amer >60 >60 mL/min   GFR calc Af Amer >60 >60 mL/min   Anion gap 5 5 - 15  Troponin I  Result Value Ref Range   Troponin I <0.03 <0.031 ng/mL  Troponin I  Result Value Ref Range   Troponin I <0.03 <0.031 ng/mL      Assessment & Plan:   Problem List Items Addressed This Visit      Cardiovascular and Mediastinum   HTN (hypertension) - Primary    Will add HCTZ. Continue to monitor. Recheck in 1 month.  Continue current regimen.       Relevant Medications   hydrochlorothiazide (HYDRODIURIL) 25 MG tablet   Other Relevant Orders   Metanephrines, plasma   Catecholamines, fractionated, Urine, 24 hour   Metanephrines, Urine, 24 hour     Other   Family history of pheochromocytoma    Will check blood work and 24 hour urine. Await results.       Relevant Orders   Metanephrines, plasma   Catecholamines, fractionated, Urine, 24 hour   Metanephrines, Urine, 24 hour       Follow up plan: Return in about 4 weeks (around 02/18/2015) for BP check.

## 2015-01-22 DIAGNOSIS — Z8489 Family history of other specified conditions: Secondary | ICD-10-CM | POA: Insufficient documentation

## 2015-01-22 NOTE — Assessment & Plan Note (Signed)
Will check blood work and 24 hour urine. Await results.

## 2015-01-22 NOTE — Assessment & Plan Note (Signed)
Will add HCTZ. Continue to monitor. Recheck in 1 month. Continue current regimen.

## 2015-01-23 ENCOUNTER — Encounter: Payer: Self-pay | Admitting: Emergency Medicine

## 2015-01-23 ENCOUNTER — Emergency Department
Admission: EM | Admit: 2015-01-23 | Discharge: 2015-01-23 | Disposition: A | Discharge status: Home / Self Care | Payer: BLUE CROSS/BLUE SHIELD | Attending: Emergency Medicine | Admitting: Emergency Medicine

## 2015-01-23 DIAGNOSIS — Z79899 Other long term (current) drug therapy: Secondary | ICD-10-CM | POA: Diagnosis not present

## 2015-01-23 DIAGNOSIS — E109 Type 1 diabetes mellitus without complications: Secondary | ICD-10-CM | POA: Insufficient documentation

## 2015-01-23 DIAGNOSIS — E860 Dehydration: Secondary | ICD-10-CM | POA: Diagnosis not present

## 2015-01-23 DIAGNOSIS — R300 Dysuria: Secondary | ICD-10-CM | POA: Diagnosis not present

## 2015-01-23 DIAGNOSIS — Z87440 Personal history of urinary (tract) infections: Secondary | ICD-10-CM | POA: Diagnosis not present

## 2015-01-23 DIAGNOSIS — F17210 Nicotine dependence, cigarettes, uncomplicated: Secondary | ICD-10-CM | POA: Diagnosis not present

## 2015-01-23 DIAGNOSIS — I10 Essential (primary) hypertension: Secondary | ICD-10-CM | POA: Diagnosis not present

## 2015-01-23 DIAGNOSIS — Z792 Long term (current) use of antibiotics: Secondary | ICD-10-CM | POA: Insufficient documentation

## 2015-01-23 DIAGNOSIS — Z794 Long term (current) use of insulin: Secondary | ICD-10-CM | POA: Insufficient documentation

## 2015-01-23 DIAGNOSIS — Z3202 Encounter for pregnancy test, result negative: Secondary | ICD-10-CM | POA: Diagnosis not present

## 2015-01-23 DIAGNOSIS — R55 Syncope and collapse: Secondary | ICD-10-CM

## 2015-01-23 DIAGNOSIS — R42 Dizziness and giddiness: Secondary | ICD-10-CM | POA: Diagnosis not present

## 2015-01-23 LAB — URINALYSIS COMPLETE WITH MICROSCOPIC (ARMC ONLY)
BACTERIA UA: NONE SEEN
BILIRUBIN URINE: NEGATIVE
Glucose, UA: NEGATIVE mg/dL
Ketones, ur: NEGATIVE mg/dL
LEUKOCYTES UA: NEGATIVE
NITRITE: POSITIVE — AB
Protein, ur: 100 mg/dL — AB
SPECIFIC GRAVITY, URINE: 1.016 (ref 1.005–1.030)
pH: 5 (ref 5.0–8.0)

## 2015-01-23 LAB — BASIC METABOLIC PANEL
ANION GAP: 11 (ref 5–15)
ANION GAP: 7 (ref 5–15)
BUN: 16 mg/dL (ref 6–20)
BUN: 18 mg/dL (ref 6–20)
CHLORIDE: 101 mmol/L (ref 101–111)
CHLORIDE: 102 mmol/L (ref 101–111)
CO2: 22 mmol/L (ref 22–32)
CO2: 25 mmol/L (ref 22–32)
Calcium: 9 mg/dL (ref 8.9–10.3)
Calcium: 8.6 mg/dL — ABNORMAL LOW (ref 8.9–10.3)
Creatinine, Ser: 1.35 mg/dL — ABNORMAL HIGH (ref 0.44–1.00)
Creatinine, Ser: 1.37 mg/dL — ABNORMAL HIGH (ref 0.44–1.00)
GFR calc Af Amer: 52 mL/min — ABNORMAL LOW (ref >60)
GFR calc non Af Amer: 46 mL/min — ABNORMAL LOW (ref >60)
GFR, EST AFRICAN AMERICAN: 53 mL/min — AB (ref >60)
GFR, EST NON AFRICAN AMERICAN: 45 mL/min — AB (ref >60)
GLUCOSE: 154 mg/dL — AB (ref 65–99)
Glucose, Bld: 210 mg/dL — ABNORMAL HIGH (ref 65–99)
POTASSIUM: 3.6 mmol/L (ref 3.5–5.1)
Potassium: 5.7 mmol/L — ABNORMAL HIGH (ref 3.5–5.1)
SODIUM: 134 mmol/L — AB (ref 135–145)
Sodium: 134 mmol/L — ABNORMAL LOW (ref 135–145)

## 2015-01-23 LAB — CBC
HEMATOCRIT: 43.9 % (ref 35.0–47.0)
HEMOGLOBIN: 14.3 g/dL (ref 12.0–16.0)
MCH: 29.2 pg (ref 26.0–34.0)
MCHC: 32.7 g/dL (ref 32.0–36.0)
MCV: 89.4 fL (ref 80.0–100.0)
Platelets: 199 10*3/uL (ref 150–440)
RBC: 4.91 MIL/uL (ref 3.80–5.20)
RDW: 14.3 % (ref 11.5–14.5)
WBC: 14.6 10*3/uL — AB (ref 3.6–11.0)

## 2015-01-23 LAB — PREGNANCY, URINE: PREG TEST UR: NEGATIVE

## 2015-01-23 LAB — TROPONIN I

## 2015-01-23 MED ORDER — CEPHALEXIN 500 MG PO CAPS: 500.0000 mg | ORAL_CAPSULE | Freq: Four times a day (QID) | ORAL | Status: DC

## 2015-01-23 MED ORDER — SODIUM CHLORIDE 0.9 % IV BOLUS (SEPSIS)
1000.0000 mL | Freq: Once | INTRAVENOUS | Status: AC
  Administered 2015-01-23: 1000 mL via INTRAVENOUS

## 2015-01-23 MED ORDER — CIPROFLOXACIN HCL 500 MG PO TABS
500.0000 mg | ORAL_TABLET | Freq: Two times a day (BID) | ORAL | Status: AC
Start: 2015-01-23 — End: 2015-01-30

## 2015-01-23 NOTE — ED Notes (Signed)
Pt states that she was out with her husband and was feeling near syncopal, states that she feared falling and breaking her rt ankle again, pt states that she slipped on ice last January and is still having trouble with that ankle, pt states that she started a new medication for her blood pressure on Thursday, hydrochlorothiazide, pt states that she is also on metoprolol and has been for years and that her quinapril dose was increased recently also to 40mg .  Pt states that her bp at home this am was 70 systolic and was 90 systolic in triage, pt states that she feels better sitting down, pt also states that her body feels weak. She states that she has increased with her urinary frequency and states that she has some pain with urination as well, pt states that she took azo this am but also states hx of interstial cystitis

## 2015-01-23 NOTE — ED Notes (Signed)
Pt reports feeling lightheaded, ringing in ears and nauseated.  States checked bp at home and it was 70/50.  Reports starting new bp med on Thursday.

## 2015-01-23 NOTE — ED Notes (Signed)
Patient able to ambulate in the hall without assistance. Patient states that she is still experiencing mild lightheadedness when going from sitting to standing.

## 2015-01-23 NOTE — Discharge Instructions (Signed)
Believe you are slightly dehydrated from your new medication. Please stop hydrochlorothiazide. Follow closely with your primary care doctor this week. Return to the emergency room right away if you develop worsening or return of severe weakness and light headedness, have a fever, nausea or vomiting, weakness, numbness tingling or other new concerns arise.  Dehydration, Adult Dehydration is a condition in which you do not have enough fluid or water in your body. It happens when you take in less fluid than you lose. Vital organs such as the kidneys, brain, and heart cannot function without a proper amount of fluids. Any loss of fluids from the body can cause dehydration.  Dehydration can range from mild to severe. This condition should be treated right away to help prevent it from becoming severe. CAUSES  This condition may be caused by:  Vomiting.  Diarrhea.  Excessive sweating, such as when exercising in hot or humid weather.  Not drinking enough fluid during strenuous exercise or during an illness.  Excessive urine output.  Fever.  Certain medicines. RISK FACTORS This condition is more likely to develop in:  People who are taking certain medicines that cause the body to lose excess fluid (diuretics).   People who have a chronic illness, such as diabetes, that may increase urination.  Older adults.   People who live at high altitudes.   People who participate in endurance sports.  SYMPTOMS  Mild Dehydration  Thirst.  Dry lips.  Slightly dry mouth.  Dry, warm skin. Moderate Dehydration  Very dry mouth.   Muscle cramps.   Dark urine and decreased urine production.   Decreased tear production.   Headache.   Light-headedness, especially when you stand up from a sitting position.  Severe Dehydration  Changes in skin.   Cold and clammy skin.   Skin does not spring back quickly when lightly pinched and released.   Changes in body fluids.    Extreme thirst.   No tears.   Not able to sweat when body temperature is high, such as in hot weather.   Minimal urine production.   Changes in vital signs.   Rapid, weak pulse (more than 100 beats per minute when you are sitting still).   Rapid breathing.   Low blood pressure.   Other changes.   Sunken eyes.   Cold hands and feet.   Confusion.  Lethargy and difficulty being awakened.  Fainting (syncope).   Short-term weight loss.   Unconsciousness. DIAGNOSIS  This condition may be diagnosed based on your symptoms. You may also have tests to determine how severe your dehydration is. These tests may include:   Urine tests.   Blood tests.  TREATMENT  Treatment for this condition depends on the severity. Mild or moderate dehydration can often be treated at home. Treatment should be started right away. Do not wait until dehydration becomes severe. Severe dehydration needs to be treated at the hospital. Treatment for Mild Dehydration  Drinking plenty of water to replace the fluid you have lost.   Replacing minerals in your blood (electrolytes) that you may have lost.  Treatment for Moderate Dehydration  Consuming oral rehydration solution (ORS). Treatment for Severe Dehydration  Receiving fluid through an IV tube.   Receiving electrolyte solution through a feeding tube that is passed through your nose and into your stomach (nasogastric tube or NG tube).  Correcting any abnormalities in electrolytes. HOME CARE INSTRUCTIONS   Drink enough fluid to keep your urine clear or pale yellow.   Drink  water or fluid slowly by taking small sips. You can also try sucking on ice cubes.  Have food or beverages that contain electrolytes. Examples include bananas and sports drinks.  Take over-the-counter and prescription medicines only as told by your health care provider.   Prepare ORS according to the manufacturer's instructions. Take sips of ORS  every 5 minutes until your urine returns to normal.  If you have vomiting or diarrhea, continue to try to drink water, ORS, or both.   If you have diarrhea, avoid:   Beverages that contain caffeine.   Fruit juice.   Milk.   Carbonated soft drinks.  Do not take salt tablets. This can lead to the condition of having too much sodium in your body (hypernatremia).  SEEK MEDICAL CARE IF:  You cannot eat or drink without vomiting.  You have had moderate diarrhea during a period of more than 24 hours.  You have a fever. SEEK IMMEDIATE MEDICAL CARE IF:   You have extreme thirst.  You have severe diarrhea.  You have not urinated in 6-8 hours, or you have urinated only a small amount of very dark urine.  You have shriveled skin.  You are dizzy, confused, or both.   This information is not intended to replace advice given to you by your health care provider. Make sure you discuss any questions you have with your health care provider.   Document Released: 01/30/2005 Document Revised: 10/21/2014 Document Reviewed: 06/17/2014 Elsevier Interactive Patient Education [employer]2016 [employer] Inc.

## 2015-01-23 NOTE — ED Provider Notes (Signed)
[facility] Emergency Department Provider Note REMINDER - THIS NOTE IS NOT A FINAL MEDICAL RECORD UNTIL IT IS SIGNED. UNTIL THEN, THE CONTENT BELOW MAY REFLECT INFORMATION FROM A DOCUMENTATION TEMPLATE, NOT THE ACTUAL PATIENT VISIT. ____________________________________________  Time seen: Approximately 2:18 PM  I have reviewed the triage vital signs and the nursing notes.   HISTORY  Chief Complaint Dizziness    HPI Brittany Zavala is a 49 y.o. female to the previous history of hypertension type 1 diabetes.  Patient presents today and states that today while she was out shopping she felt lightheaded, she continued to be at [facility] and then again to feel very lightheaded. They went home and took her blood pressure and noticed to be in the 70s to 90 range.  She denies any pain, numbness tingling weakness facial droop or spinning sensation. No trouble breathing. She does report she was feeling very lightheaded, the symptoms have resolved now. She felt as though she was going to pass out but did not. She has occasionally noticed some slight discomfort with urination as well, however this is not uncommon for her with a history of ongoing cystitis. She took Azo today and reports that her urine has been bright since. Was last treated for urinary tract infection with similar symptoms about 2 months ago with ciprofloxacin.  Denies pregnancy. The present time she not having any symptoms.  Patient started hydrochlorothiazide 2 days ago, has increased urination and slight thirst.  Past Medical History  Diagnosis Date  . Hyperlipidemia   . Hypertension   . Depression   . Anxiety   . Insomnia   . CAD (coronary artery disease)   . Chronic UTI   . Gallbladder polyp March 2016    needs f/u scan in Sept 2016  . Type 1 diabetes mellitus (HCC)   . Hematuria   . Insomnia   . Tobacco abuse   . Gall bladder polyp 3/16    On UA- needs follow up scan 6 months later,  (9/16)  . Furuncle of right axilla   . Current use of insulin (HCC)   . ANA positive Oct 2015    1:160 speckled    Patient Active Problem List   Diagnosis Date Noted  . Family history of pheochromocytoma 01/22/2015  . Pulmonary nodules/lesions, multiple 01/11/2015  . Hypoglycemia associated with diabetes (HCC) 08/27/2014  . Acute hemorrhagic enterocolitis (HCC) 08/27/2014  . Leukocytosis 08/27/2014  . Abdominal pain 08/27/2014  . Campylobacter enteritis 08/27/2014  . GI bleed 08/25/2014  . Anxiety 08/25/2014  . HLD (hyperlipidemia) 08/25/2014  . HTN (hypertension) 08/25/2014  . GERD (gastroesophageal reflux disease) 08/25/2014  . Type 1 diabetes (HCC) 08/25/2014  . Gallbladder polyp 07/16/2014  . Tobacco abuse   . Current use of insulin (HCC)   . Insomnia   . CAD (coronary artery disease)   . Gallbladder polyp 04/14/2014  . ANA positive 11/13/2013    Past Surgical History  Procedure Laterality Date  . Cesarean section    . Tonsilectomy/adenoidectomy with myringotomy    . Nasal sinus surgery    . Appendectomy    . Cesarean section      x2    Current Outpatient Rx  Name  Route  Sig  Dispense  Refill  . albuterol (PROVENTIL) (2.5 MG/3ML) 0.083% nebulizer solution   Nebulization   Take 3 mLs (2.5 mg total) by nebulization every 6 (six) hours as needed for wheezing or shortness of breath.   150 mL  1   . BD PEN NEEDLE NANO U/F 32G X 4 MM MISC                 Dispense as written.   . cephALEXin (KEFLEX) 500 MG capsule   Oral   Take 1 capsule (500 mg total) by mouth 4 (four) times daily.   40 capsule   0   . hydrochlorothiazide (HYDRODIURIL) 25 MG tablet   Oral   Take 1 tablet (25 mg total) by mouth daily.   30 tablet   3   . hydrOXYzine (ATARAX/VISTARIL) 25 MG tablet      TAKE ONE TABLET AT BEDTIME AS NEEDED   30 tablet   7   . Insulin Human (INSULIN PUMP) SOLN   Subcutaneous   Inject into the skin.         . metoprolol succinate (TOPROL-XL)  25 MG 24 hr tablet      TAKE 1 TABLET EVERY DAY   30 tablet   6   . nicotine (NICOTROL) 10 MG inhaler   Inhalation   Inhale 1 cartridge (1 continuous puffing total) into the lungs as needed for smoking cessation.   42 each   0   . omeprazole (PRILOSEC) 20 MG capsule   Oral   Take 20 mg by mouth daily.          . ONE TOUCH ULTRA TEST test strip                 Dispense as written.   Marland Kitchen PREVIDENT 5000 BOOSTER PLUS 1.1 % PSTE                 Dispense as written.   . quinapril (ACCUPRIL) 40 MG tablet   Oral   Take 1 tablet (40 mg total) by mouth daily.   30 tablet   1   . rosuvastatin (CRESTOR) 10 MG tablet      TAKE ONE TABLET AT BEDTIME FOR CHOLESTEROL   30 tablet   6     Allergies Morphine and related; Sulfa antibiotics; Tramadol; Vicodin; and Septra  Family History  Problem Relation Age of Onset  . Cancer Mother     breast  . Stroke Mother   . Dementia Mother   . Addison's disease Mother   . Diabetes Mother   . Heart disease Mother   . Heart disease Father   . Cancer Sister     breast  . Asthma Daughter   . Allergies Daughter     Social History Social History  Substance Use Topics  . Smoking status: Current Every Day Smoker -- 1.00 packs/day    Types: Cigarettes  . Smokeless tobacco: Never Used  . Alcohol Use: No    Review of Systems Constitutional: No fever/chills Eyes: No visual changes. ENT: No sore throat. Cardiovascular: Denies chest pain. Respiratory: Denies shortness of breath. Gastrointestinal: No abdominal pain.  No nausea, no vomiting.  No diarrhea.  No constipation. Genitourinary: Doing some mild dysuria for the last 3-4 days, but she does report with her history of cystitis that she occasionally gets these symptoms may not always indicate infection. Last treated for infection about 2 months ago with somewhat similar symptoms of burning with urination. Musculoskeletal: Negative for back pain. Denies pregnancy. Skin:  Negative for rash. Neurological: Negative for headaches, focal weakness or numbness. She did feel generally weak earlier this is improved.  10-point ROS otherwise negative.  ____________________________________________   PHYSICAL EXAM:  VITAL SIGNS: ED  Triage Vitals  Enc Vitals Group     BP 01/23/15 1310 92/61 mmHg     Pulse Rate 01/23/15 1310 9     Resp 01/23/15 1310 16     Temp --      Temp src --      SpO2 01/23/15 1310 99 %     Weight 01/23/15 1310 232 lb (105.235 kg)     Height 01/23/15 1310 5\' 8"  (1.727 m)     Head Cir --      Peak Flow --      Pain Score --      Pain Loc --      Pain Edu? --      Excl. in GC? --    Constitutional: Alert and oriented. Well appearing and in no acute distress. Eyes: Conjunctivae are normal. PERRL. EOMI. Head: Atraumatic. Nose: No congestion/rhinnorhea. Mouth/Throat: Mucous membranes are dry.  Oropharynx non-erythematous. Neck: No stridor.   Cardiovascular: Normal rate, regular rhythm. Grossly normal heart sounds.  Good peripheral circulation. Respiratory: Normal respiratory effort.  No retractions. Lungs CTAB. Gastrointestinal: Soft and nontender. No distention. No abdominal bruits. No CVA tenderness. Musculoskeletal: No lower extremity tenderness nor edema.  No joint effusions. Neurologic:  Normal speech and language. No gross focal neurologic deficits are appreciated.   The patient has no pronator drift. The patient has normal cranial nerve exam. Extraocular movements are normal. Visual fields are normal. Patient has 5 out of 5 strength in all extremities. There is no numbness or gross, acute sensory abnormality in the extremities bilaterally. No speech disturbance. No dysarthria. No aphasia. No ataxia. Normal finger nose finger bilat. Patient speaking in full and clear sentences.  Skin:  Skin is warm, dry and intact. No rash noted. Psychiatric: Mood and affect are normal. Speech and behavior are  normal.  ____________________________________________   LABS (all labs ordered are listed, but only abnormal results are displayed)  Labs Reviewed  CBC - Abnormal; Notable for the following:    WBC 14.6 (*)    All other components within normal limits  BASIC METABOLIC PANEL - Abnormal; Notable for the following:    Sodium 134 (*)    Potassium 5.7 (*)    Glucose, Bld 210 (*)    Creatinine, Ser 1.35 (*)    GFR calc non Af Amer 46 (*)    GFR calc Af Amer 53 (*)    All other components within normal limits  URINE CULTURE  TROPONIN I  URINALYSIS COMPLETEWITH MICROSCOPIC ([facility] ONLY)  BASIC METABOLIC PANEL  POC URINE PREG, ED   ____________________________________________  EKG  Reviewed and interpreted by me EKG time 1325 Normal sinus rhythm Heart rate 90 QRS 78 QTc 470 Very nonspecific T-wave abnormality seen in inferolateral distribution, nothing to suggest acute ischemia.      Pulmonary Embolism Rule-out Criteria (PERC rule)                        If YES to ANY of the following, the PERC rule is not satisfied and cannot be used to rule out PE in this patient (consider d-dimer or imaging depending on pre-test probability).                      If NO to ALL of the following, AND the clinician's pre-test probability is <15%, the [facility] rule is satisfied and there is no need for further workup (including no need to obtain a d-dimer) as the  post-test probability of pulmonary embolism is <2%.                      Mnemonic is HAD CLOTS   H - hormone use (exogenous estrogen)      No. A - age > 50                                                 No. D - DVT/PE history                                      No.   C - coughing blood (hemoptysis)                 No. L - leg swelling, unilateral                             No. O - O2 Sat on Room Air < 95%                  No. T - tachycardia (HR ? 100)                         No. S - surgery or trauma, recent                       No.   Based on my evaluation of the patient, including application of this decision instrument, further testing to evaluate for pulmonary embolism is not indicated at this time. I have discussed this recommendation with the patient who states understanding and agreement with this plan.  ____________________________________________  RADIOLOGY  No acute neurologic abnormalities. Denies any cardio pulmonary symptoms with clear lungs normal oxygen saturation. He denies he no evidence for need for emergent imaging of the head or chest at this time. No abdominal pain. ____________________________________________   PROCEDURES  Procedure(s) performed: None  Critical Care performed: No  ____________________________________________   INITIAL IMPRESSION / ASSESSMENT AND PLAN / ED COURSE  Pertinent labs & imaging results that were available during my care of the patient were reviewed by me and considered in my medical decision making (see chart for details).  Patient presents for evaluation of near-syncope with associated hypotension. This is in the setting of recently starting hydrochlorothiazide feeling thirsty and lightheaded. Her diabetes appears to be well controlled, no anion gap, and no evidence of DKA. She does have mild leukocytosis, this is associated with some mild dysuria. We will evaluate for a urinary tract infection. I plan to hydrate her generously, and we will repeat her chemistry given a level of hemolysis is noted to assure she is not hyperkalemic.  No acute neurologic, cardiac or pulmonary symptoms aside from a near syncopal feeling. This is likely secondary dehydration. We will follow up closely, hydrate her generously, obtain orthostatics, and follow-up on urinalysis and repeat chemistry. Plan of care as above outlined to Dr. Huel CoteQuigley, anticipate the patient could be discharged home if her chemistry does not show significant hyperkalemia and she is not orthostatic and her  symptoms continued to be resolved. As she is on Azo, if her urinalysis cannot be performed due to interference I would  advise placing her on cephalexin for possible uterine tract infection as we await culture data.   ____________________________________________   FINAL CLINICAL IMPRESSION(S) / ED DIAGNOSES  Final diagnoses:  Dysuria  Mild dehydration  Near syncope      Sharyn Creamer, MD 01/23/15 [phone]

## 2015-01-23 NOTE — ED Provider Notes (Signed)
-----------------------------------------   4:44 PM on 01/23/2015 -----------------------------------------   Blood pressure 97/62, pulse 89, resp. rate 21, height 5\' 8"  (1.727 m), weight 232 lb (105.235 kg), last menstrual period 12/24/2014, SpO2 96 %.  Assuming care from Dr. Fanny BienQuale.  In short, Brittany Zavala is a 49 y.o. female with a chief complaint of Dizziness .  Refer to the original H&P for additional details.  The current plan of care is to follow patient's urinalysis which does show indications of a urinary tract infection (see results). The patient does have a urine culture pending. She had been prescribed Keflex but in conversation with the patient she states that ciprofloxacin normally works better for her urinary tract infections. He assured her that she will be contacted if the Cipro was not the appropriate antibiotic. The patient received IV fluids and has some mild renal insufficiency. The patient felt improved after the IV fluids and repeat potassium was within normal limits. Parties agree that she should hold her hydrochlorothiazide until appropriate follow-up with her primary physician. She should drink plenty of fluids and return here if she has any other new concerns.   Jennye MoccasinBrian S Jahmire Ruffins, MD 01/23/15 [phone]

## 2015-01-25 LAB — URINE CULTURE

## 2015-01-26 ENCOUNTER — Other Ambulatory Visit: Payer: BLUE CROSS/BLUE SHIELD

## 2015-01-26 DIAGNOSIS — I25119 Atherosclerotic heart disease of native coronary artery with unspecified angina pectoris: Secondary | ICD-10-CM

## 2015-01-26 DIAGNOSIS — R319 Hematuria, unspecified: Secondary | ICD-10-CM

## 2015-01-26 LAB — UA/M W/RFLX CULTURE, ROUTINE
BILIRUBIN UA: NEGATIVE
KETONES UA: NEGATIVE
LEUKOCYTES UA: NEGATIVE
Nitrite, UA: NEGATIVE
PROTEIN UA: NEGATIVE
UUROB: 0.2 mg/dL (ref 0.2–1.0)
pH, UA: 5 (ref 5.0–7.5)

## 2015-01-26 LAB — MICROSCOPIC EXAMINATION
Bacteria, UA: NONE SEEN
WBC UA: NONE SEEN /HPF (ref 0–?)

## 2015-01-27 ENCOUNTER — Telehealth: Payer: Self-pay | Admitting: Family Medicine

## 2015-01-27 ENCOUNTER — Ambulatory Visit
Admission: RE | Admit: 2015-01-27 | Discharge: 2015-01-27 | Disposition: A | Discharge status: Home / Self Care | Payer: BLUE CROSS/BLUE SHIELD | Source: Ambulatory Visit | Attending: Family Medicine | Admitting: Family Medicine

## 2015-01-27 DIAGNOSIS — K824 Cholesterolosis of gallbladder: Secondary | ICD-10-CM | POA: Insufficient documentation

## 2015-01-27 LAB — BASIC METABOLIC PANEL
BUN / CREAT RATIO: 18 (ref 9–23)
BUN: 11 mg/dL (ref 6–24)
CALCIUM: 9.1 mg/dL (ref 8.7–10.2)
CHLORIDE: 97 mmol/L (ref 96–106)
CO2: 22 mmol/L (ref 18–29)
CREATININE: 0.6 mg/dL (ref 0.57–1.00)
GFR, EST AFRICAN AMERICAN: 126 mL/min/{1.73_m2} (ref >59)
GFR, EST NON AFRICAN AMERICAN: 109 mL/min/{1.73_m2} (ref >59)
Glucose: 236 mg/dL — ABNORMAL HIGH (ref 65–99)
Potassium: 4.6 mmol/L (ref 3.5–5.2)
Sodium: 136 mmol/L (ref 134–144)

## 2015-01-27 NOTE — Telephone Encounter (Signed)
Patient notified

## 2015-01-27 NOTE — Telephone Encounter (Signed)
Can you please let Brittany DresserConnie know that we're waiting on the urine tests, but her blood test from yesterday is back to normal. Her kidneys look great. She was probably just dehydrated in the ER

## 2015-01-30 LAB — METANEPHRINES, URINE, 24 HOUR
METANEPHRINES 24H UR: 144 ug/(24.h) (ref 45–290)
Metaneph Total, Ur: 48 ug/L
NORMETANEPHRINE UR: 127 ug/L
Normetanephrine, 24H Ur: 381 ug/24 hr (ref 82–500)

## 2015-02-01 ENCOUNTER — Ambulatory Visit: Payer: BLUE CROSS/BLUE SHIELD

## 2015-02-02 LAB — CATECHOLAMINES, FRACTIONATED, URINE, 24 HOUR
DOPAMINE 24H UR: 180 ug/(24.h) (ref 0–510)
DOPAMINE RANDOM UR: 60 ug/L
Epinephrine, 24H Ur: 6 ug/24 hr (ref 0–20)
Epinephrine, Rand Ur: 2 ug/L
Norepinephrine, 24H Ur: 48 ug/24 hr (ref 0–135)
Norepinephrine, Rand Ur: 16 ug/L

## 2015-02-03 LAB — METANEPHRINES, PLASMA
Metanephrine, Free: 16 pg/mL (ref 0–62)
Normetanephrine, Free: 107 pg/mL (ref 0–145)

## 2015-02-18 ENCOUNTER — Ambulatory Visit: Payer: BLUE CROSS/BLUE SHIELD | Admitting: Family Medicine

## 2015-02-19 ENCOUNTER — Encounter: Payer: Self-pay | Admitting: Family Medicine

## 2015-02-19 ENCOUNTER — Ambulatory Visit (INDEPENDENT_AMBULATORY_CARE_PROVIDER_SITE_OTHER): Payer: BLUE CROSS/BLUE SHIELD | Admitting: Family Medicine

## 2015-02-19 VITALS — BP 170/89 | HR 90 | Temp 99.2°F | Ht 67.0 in | Wt 232.0 lb

## 2015-02-19 DIAGNOSIS — R300 Dysuria: Secondary | ICD-10-CM

## 2015-02-19 DIAGNOSIS — I10 Essential (primary) hypertension: Secondary | ICD-10-CM | POA: Diagnosis not present

## 2015-02-19 DIAGNOSIS — N3001 Acute cystitis with hematuria: Secondary | ICD-10-CM

## 2015-02-19 LAB — MICROSCOPIC EXAMINATION

## 2015-02-19 MED ORDER — CIPROFLOXACIN HCL 500 MG PO TABS: 500.0000 mg | ORAL_TABLET | Freq: Two times a day (BID) | ORAL | Status: DC

## 2015-02-19 MED ORDER — METOPROLOL SUCCINATE ER 25 MG PO TB24: 37.5000 mg | ORAL_TABLET | Freq: Every day | ORAL | Status: DC

## 2015-02-19 NOTE — Progress Notes (Signed)
BP 170/89 mmHg  Pulse 90  Temp(Src) 99.2 F (37.3 C)  Ht 5\' 7"  (1.702 m)  Wt 232 lb (105.235 kg)  BMI 36.33 kg/m2  SpO2 99%  LMP 12/24/2014 (LMP Unknown)   Subjective:    Patient ID: Brittany Zavala, female    DOB: [birth year], 50 y.o.   MRN: [id number]  HPI: Brittany Zavala is a 50 y.o. female  Chief Complaint  Patient presents with  . Hypertension   HYPERTENSION- forgot to take her medication until about an hour ago. Has been having some eye floaters Hypertension status: uncontrolled  Satisfied with current treatment? no Duration of hypertension: chronic BP monitoring frequency:  Every couple of days or if she feels funny BP range: 150s/ 70s BP medication side effects:  no Medication compliance: good compliance Aspirin: no Recurrent headaches: yes Visual changes: yes Palpitations: no Dyspnea: no Chest pain: no Lower extremity edema: no Dizzy/lightheaded: no  URINARY SYMPTOMS Duration: started this AM Dysuria: no Urinary frequency: no Urgency: yes Small volume voids: yes Symptom severity: moderate Urinary incontinence: yes Foul odor: no Hematuria: yes- yes, but started her period yesterday Abdominal pain: no Back pain: no Suprapubic pain/pressure: yes Flank pain: no Fever:  no Vomiting: no Relief with cranberry juice: no Relief with pyridium: no Status: stable Previous urinary tract infection: yes Recurrent urinary tract infection: yes Sexual activity: Monogamous History of sexually transmitted disease: no Vaginal discharge: no Treatments attempted: pyridium    Relevant past medical, surgical, family and social history reviewed and updated as indicated. Interim medical history since our last visit reviewed. Allergies and medications reviewed and updated.  Review of Systems  Constitutional: Negative.   Respiratory: Negative.   Cardiovascular: Negative.   Gastrointestinal: Negative.   Genitourinary: Positive for dysuria,  urgency, hematuria and flank pain. Negative for frequency, decreased urine volume, vaginal bleeding, vaginal discharge, enuresis, difficulty urinating, genital sores, vaginal pain, menstrual problem, pelvic pain and dyspareunia.  Psychiatric/Behavioral: Negative.     Per HPI unless specifically indicated above     Objective:    BP 170/89 mmHg  Pulse 90  Temp(Src) 99.2 F (37.3 C)  Ht 5\' 7"  (1.702 m)  Wt 232 lb (105.235 kg)  BMI 36.33 kg/m2  SpO2 99%  LMP 12/24/2014 (LMP Unknown)  Wt Readings from Last 3 Encounters:  02/19/15 232 lb (105.235 kg)  01/23/15 232 lb (105.235 kg)  01/21/15 231 lb (104.781 kg)    Physical Exam  Constitutional: She is oriented to person, place, and time. She appears well-developed and well-nourished. No distress.  HENT:  Head: Normocephalic and atraumatic.  Right Ear: Hearing normal.  Left Ear: Hearing normal.  Nose: Nose normal.  Eyes: Conjunctivae and lids are normal. Right eye exhibits no discharge. Left eye exhibits no discharge. No scleral icterus.  Cardiovascular: Normal rate, regular rhythm, normal heart sounds and intact distal pulses.  Exam reveals no gallop and no friction rub.   No murmur heard. Pulmonary/Chest: Effort normal and breath sounds normal. No respiratory distress. She has no wheezes. She has no rales. She exhibits no tenderness.  Abdominal: Soft. Bowel sounds are normal. She exhibits no distension and no mass. There is no tenderness. There is CVA tenderness. There is no rebound and no guarding.  Musculoskeletal: Normal range of motion.  Neurological: She is alert and oriented to person, place, and time.  Skin: Skin is warm, dry and intact. No rash noted. No erythema. No pallor.  Psychiatric: She has a normal mood and affect. Her speech is normal  and behavior is normal. Judgment and thought content normal. Cognition and memory are normal.  Nursing note and vitals reviewed.   Results for orders placed or performed in visit on  01/26/15  Microscopic Examination  Result Value Ref Range   WBC, UA None seen 0 -  5 /hpf   RBC, UA 3-10 (A) 0 -  2 /hpf   Epithelial Cells (non renal) 0-10 0 - 10 /hpf   Bacteria, UA None seen None seen/Few  Basic metabolic panel  Result Value Ref Range   Glucose 236 (H) 65 - 99 mg/dL   BUN 11 6 - 24 mg/dL   Creatinine, Ser 1.61 0.57 - 1.00 mg/dL   GFR calc non Af Amer 109 >59 mL/min/1.73   GFR calc Af Amer 126 >59 mL/min/1.73   BUN/Creatinine Ratio 18 9 - 23   Sodium 136 134 - 144 mmol/L   Potassium 4.6 3.5 - 5.2 mmol/L   Chloride 97 96 - 106 mmol/L   CO2 22 18 - 29 mmol/L   Calcium 9.1 8.7 - 10.2 mg/dL  UA/M w/rflx Culture, Routine  Result Value Ref Range   Specific Gravity, UA <1.005 (L) 1.005 - 1.030   pH, UA 5.0 5.0 - 7.5   Color, UA Yellow Yellow   Appearance Ur Clear Clear   Leukocytes, UA Negative Negative   Protein, UA Negative Negative/Trace   Glucose, UA 1+ (A) Negative   Ketones, UA Negative Negative   RBC, UA 3+ (A) Negative   Bilirubin, UA Negative Negative   Urobilinogen, Ur 0.2 0.2 - 1.0 mg/dL   Nitrite, UA Negative Negative   Microscopic Examination See below:       Assessment & Plan:   Problem List Items Addressed This Visit      Cardiovascular and Mediastinum   HTN (hypertension) - Primary    Not well controlled. Will increase her metoprolol to 37.5mg  daily and check again in 1 month. Continue to monitor.       Relevant Medications   metoprolol succinate (TOPROL-XL) 25 MG 24 hr tablet    Other Visit Diagnoses    Dysuria        Will check UA today. Await results.     Relevant Orders    UA/M w/rflx Culture, Routine    Acute cystitis with hematuria        Will treat with cipro. Call if not getting better or getting worse.         Follow up plan: Return in about 4 weeks (around 03/19/2015) for BP .

## 2015-02-19 NOTE — Assessment & Plan Note (Signed)
Not well controlled. Will increase her metoprolol to 37.5mg  daily and check again in 1 month. Continue to monitor.

## 2015-02-21 LAB — UA/M W/RFLX CULTURE, ROUTINE

## 2015-03-04 ENCOUNTER — Other Ambulatory Visit: Payer: Self-pay | Admitting: Family Medicine

## 2015-03-09 LAB — HEMOGLOBIN A1C: Hemoglobin A1C: 7.3

## 2015-03-19 ENCOUNTER — Other Ambulatory Visit: Payer: Self-pay | Admitting: Family Medicine

## 2015-03-19 ENCOUNTER — Encounter: Payer: BLUE CROSS/BLUE SHIELD | Admitting: Family Medicine

## 2015-03-19 DIAGNOSIS — Z0000 Encounter for general adult medical examination without abnormal findings: Secondary | ICD-10-CM

## 2015-03-19 NOTE — Progress Notes (Signed)
Patient started her cycle yesterday. Will postpone physical so we can do pap. This encounter was created in error - please disregard.

## 2015-03-20 ENCOUNTER — Other Ambulatory Visit: Payer: Self-pay | Admitting: Family Medicine

## 2015-03-25 ENCOUNTER — Ambulatory Visit (INDEPENDENT_AMBULATORY_CARE_PROVIDER_SITE_OTHER): Payer: BLUE CROSS/BLUE SHIELD | Admitting: Family Medicine

## 2015-03-25 ENCOUNTER — Encounter: Payer: Self-pay | Admitting: Family Medicine

## 2015-03-25 VITALS — BP 139/83 | HR 101 | Temp 98.9°F | Ht 67.3 in | Wt 228.0 lb

## 2015-03-25 DIAGNOSIS — Z720 Tobacco use: Secondary | ICD-10-CM | POA: Diagnosis not present

## 2015-03-25 DIAGNOSIS — R319 Hematuria, unspecified: Secondary | ICD-10-CM

## 2015-03-25 DIAGNOSIS — Z124 Encounter for screening for malignant neoplasm of cervix: Secondary | ICD-10-CM | POA: Diagnosis not present

## 2015-03-25 DIAGNOSIS — Z0000 Encounter for general adult medical examination without abnormal findings: Secondary | ICD-10-CM | POA: Diagnosis not present

## 2015-03-25 DIAGNOSIS — F419 Anxiety disorder, unspecified: Secondary | ICD-10-CM

## 2015-03-25 DIAGNOSIS — E109 Type 1 diabetes mellitus without complications: Secondary | ICD-10-CM | POA: Diagnosis not present

## 2015-03-25 LAB — UA/M W/RFLX CULTURE, ROUTINE
Bilirubin, UA: NEGATIVE
GLUCOSE, UA: NEGATIVE
Ketones, UA: NEGATIVE
LEUKOCYTES UA: NEGATIVE
NITRITE UA: NEGATIVE
Protein, UA: NEGATIVE
Urobilinogen, Ur: 0.2 mg/dL (ref 0.2–1.0)
pH, UA: 5.5 (ref 5.0–7.5)

## 2015-03-25 LAB — MICROSCOPIC EXAMINATION: WBC UA: NONE SEEN /HPF (ref 0–?)

## 2015-03-25 LAB — HM PAP SMEAR: HM PAP: NEGATIVE

## 2015-03-25 MED ORDER — VARENICLINE TARTRATE 0.5 MG X 11 & 1 MG X 42 PO MISC
ORAL | Status: DC
Start: 2015-03-25 — End: 2015-06-24

## 2015-03-25 MED ORDER — VARENICLINE TARTRATE 1 MG PO TABS: 1.0000 mg | ORAL_TABLET | Freq: Two times a day (BID) | ORAL | Status: DC

## 2015-03-25 MED ORDER — FLUOXETINE HCL 20 MG PO TABS: 20.0000 mg | ORAL_TABLET | Freq: Every day | ORAL | Status: DC

## 2015-03-25 NOTE — Assessment & Plan Note (Signed)
Not under good control. Under a lot of stress with her son and some social issues. Would like to go back on prozac. Will start her on  daily and recheck in 1 month to see how she's doing.

## 2015-03-25 NOTE — Progress Notes (Signed)
BP 139/83 mmHg  Pulse 101  Temp(Src) 98.9 F (37.2 C)  Ht 5' 7.3" (1.709 m)  Wt 228 lb (103.42 kg)  BMI 35.41 kg/m2  SpO2 98%  LMP 03/18/2015 (Exact Date)   Subjective:    Patient ID: Brittany Zavala, female    DOB: May 14, 1965, 50 y.o.   MRN: [id number]  HPI: Brittany Zavala is a 50 y.o. female presenting on 03/25/2015 for comprehensive medical examination. Current medical complaints include:  SMOKING CESSATION Smoking Status: current every day smoker Smoking Amount: 1ppd Smoking Onset: 50yo Smoking Quit Date: not set Smoking triggers: stress Type of tobacco use: cigarettes Children in the house: no Other household members who smoke: no Treatments attempted: chantix, nictrol, hypotism, wellbutrin Pneumovax: no up to date  DEPRESSION- Would like to go back on the prozac for a bit. Has been having some issues with her son and feeling pretty stressed. Would like to get back on medicine for a few months until she feels a bit better Mood status: exacerbated Satisfied with current treatment?: no Symptom severity: moderate  Duration of current treatment : not on anything Psychotherapy/counseling: no  Previous psychiatric medications: prozac Depressed mood: yes Anxious mood: yes Anhedonia: no Significant weight loss or gain: no Insomnia: yes hard to fall asleep Fatigue: yes Feelings of worthlessness or guilt: yes Impaired concentration/indecisiveness: yes Suicidal ideations: no Hopelessness: yes Crying spells: yes  She currently lives with: kids and husband Menopausal Symptoms: yes  Depression Screen done today and results listed below:  Depression screen [facility] 2/9 03/25/2015  Decreased Interest 2  Down, Depressed, Hopeless 1  PHQ - 2 Score 3  Altered sleeping 2  Tired, decreased energy 2  Change in appetite 1  Feeling bad or failure about yourself  1  Trouble concentrating 2  Moving slowly or fidgety/restless 1  Suicidal thoughts 0  PHQ-9 Score 12   Difficult doing work/chores Somewhat difficult    Past Medical History:  Past Medical History  Diagnosis Date  . Hyperlipidemia   . Hypertension   . Depression   . Anxiety   . Insomnia   . CAD (coronary artery disease)   . Chronic UTI   . Gallbladder polyp March 2016    needs f/u scan in Sept 2016  . Type 1 diabetes mellitus (HCC)   . Hematuria   . Insomnia   . Tobacco abuse   . Gall bladder polyp 3/16    On UA- needs follow up scan 6 months later, (9/16)  . Furuncle of right axilla   . Current use of insulin (HCC)   . ANA positive Oct 2015    1:160 speckled    Surgical History:  Past Surgical History  Procedure Laterality Date  . Cesarean section    . Tonsilectomy/adenoidectomy with myringotomy    . Nasal sinus surgery    . Appendectomy    . Cesarean section      x2    Medications:  Current Outpatient Prescriptions on File Prior to Visit  Medication Sig  . albuterol (PROVENTIL) (2.5 MG/3ML) 0.083% nebulizer solution Take 3 mLs (2.5 mg total) by nebulization every 6 (six) hours as needed for wheezing or shortness of breath.  . BD PEN NEEDLE NANO U/F 32G X 4 MM MISC See admin instructions. Use as directed.  . hydrOXYzine (ATARAX/VISTARIL) 25 MG tablet TAKE ONE TABLET AT BEDTIME AS NEEDED  . Insulin Human (INSULIN PUMP) SOLN Inject into the skin continuous. Use as directed continuously.  . metoprolol succinate (TOPROL-XL) 25  MG 24 hr tablet Take 1.5 tablets (37.5 mg total) by mouth daily.  . nicotine (NICOTROL) 10 MG inhaler Inhale 1 cartridge (1 continuous puffing total) into the lungs as needed for smoking cessation.  Marland Kitchen omeprazole (PRILOSEC) 20 MG capsule TAKE ONE CAPSULE DAILY  . ONE TOUCH ULTRA TEST test strip 1 each by Other route 4 (four) times daily.   Marland Kitchen PREVIDENT 5000 BOOSTER PLUS 1.1 % PSTE Take 1 application by mouth 2 (two) times daily.   . quinapril (ACCUPRIL) 40 MG tablet TAKE ONE TABLET BY MOUTH DAILY  . rosuvastatin (CRESTOR) 10 MG tablet TAKE ONE  TABLET AT BEDTIME FOR CHOLESTEROL   Current Facility-Administered Medications on File Prior to Visit  Medication  . albuterol (PROVENTIL) (2.5 MG/3ML) 0.083% nebulizer solution 2.5 mg    Allergies:  Allergies  Allergen Reactions  . Morphine And Related Nausea And Vomiting  . Sulfa Antibiotics Hives  . Tramadol Nausea And Vomiting  . Vicodin [Hydrocodone-Acetaminophen] Nausea And Vomiting  . Septra [Sulfamethoxazole-Trimethoprim] Rash    Social History:  Social History   Social History  . Marital Status: Married    Spouse Name: N/A  . Number of Children: N/A  . Years of Education: N/A   Occupational History  . Not on file.   Social History Main Topics  . Smoking status: Current Every Day Smoker -- 1.00 packs/day    Types: Cigarettes  . Smokeless tobacco: Never Used  . Alcohol Use: No  . Drug Use: No  . Sexual Activity: Not on file   Other Topics Concern  . Not on file   Social History Narrative   History  Smoking status  . Current Every Day Smoker -- 1.00 packs/day  . Types: Cigarettes  Smokeless tobacco  . Never Used   History  Alcohol Use No    Family History:  Family History  Problem Relation Age of Onset  . Cancer Mother     breast  . Stroke Mother   . Dementia Mother   . Addison's disease Mother   . Diabetes Mother   . Heart disease Mother   . Heart disease Father   . Cancer Sister     breast  . Asthma Daughter   . Allergies Daughter     Past medical history, surgical history, medications, allergies, family history and social history reviewed with patient today and changes made to appropriate areas of the chart.   Review of Systems  Constitutional: Negative.   HENT: Positive for congestion. Negative for ear discharge, ear pain, hearing loss, nosebleeds, sore throat and tinnitus.   Eyes: Negative.   Respiratory: Positive for cough, sputum production, shortness of breath and wheezing. Negative for hemoptysis and stridor.   Cardiovascular:  Negative.   Gastrointestinal: Positive for heartburn. Negative for nausea, vomiting, abdominal pain, diarrhea, constipation, blood in stool and melena.  Genitourinary: Negative.   Musculoskeletal: Negative.   Skin: Negative.   Neurological: Positive for dizziness. Negative for tingling, tremors, sensory change, speech change, focal weakness, seizures, loss of consciousness and headaches.  Endo/Heme/Allergies: Positive for polydipsia. Negative for environmental allergies. Does not bruise/bleed easily.  Psychiatric/Behavioral: Positive for depression. Negative for suicidal ideas, hallucinations, memory loss and substance abuse. The patient is nervous/anxious and has insomnia.     All other ROS negative except what is listed above and in the HPI.      Objective:    BP 139/83 mmHg  Pulse 101  Temp(Src) 98.9 F (37.2 C)  Ht 5' 7.3" (1.709 m)  Wt 228 lb (103.42 kg)  BMI 35.41 kg/m2  SpO2 98%  LMP 03/18/2015 (Exact Date)  Wt Readings from Last 3 Encounters:  03/25/15 228 lb (103.42 kg)  02/19/15 232 lb (105.235 kg)  01/23/15 232 lb (105.235 kg)    Physical Exam  Constitutional: She is oriented to person, place, and time. She appears well-developed and well-nourished. No distress.  HENT:  Head: Normocephalic and atraumatic.  Right Ear: Hearing, tympanic membrane, external ear and ear canal normal.  Left Ear: Hearing, tympanic membrane, external ear and ear canal normal.  Nose: Nose normal.  Mouth/Throat: Uvula is midline, oropharynx is clear and moist and mucous membranes are normal. No oropharyngeal exudate.  Eyes: Conjunctivae, EOM and lids are normal. Pupils are equal, round, and reactive to light. Right eye exhibits no discharge. Left eye exhibits no discharge. No scleral icterus.  Neck: Neck supple. No JVD present. No tracheal deviation present. No thyromegaly present.  Cardiovascular: Normal rate, regular rhythm, normal heart sounds and intact distal pulses.  Exam reveals no  gallop and no friction rub.   No murmur heard. Pulmonary/Chest: Effort normal and breath sounds normal. No stridor. No respiratory distress. She has no decreased breath sounds. She has no wheezes. She has no rhonchi. She has no rales. She exhibits no tenderness. Right breast exhibits no inverted nipple, no mass, no nipple discharge, no skin change and no tenderness. Left breast exhibits no inverted nipple, no mass, no nipple discharge, no skin change and no tenderness. Breasts are symmetrical.  Abdominal: Soft. Bowel sounds are normal. She exhibits no distension and no mass. There is no tenderness. There is no rebound and no guarding. Hernia confirmed negative in the right inguinal area and confirmed negative in the left inguinal area.  Insulin pump port in LUQ  Genitourinary: Vagina normal and uterus normal. No breast swelling, tenderness, discharge or bleeding. No labial fusion. There is no rash, tenderness, lesion or injury on the right labia. There is no rash, tenderness, lesion or injury on the left labia. Uterus is not deviated, not enlarged, not fixed and not tender. Cervix exhibits no motion tenderness, no discharge and no friability. Right adnexum displays no mass, no tenderness and no fullness. Left adnexum displays no mass, no tenderness and no fullness. No erythema, tenderness or bleeding in the vagina. No foreign body around the vagina. No signs of injury around the vagina. No vaginal discharge found.  Musculoskeletal: Normal range of motion. She exhibits no edema or tenderness.  Lymphadenopathy:    She has no cervical adenopathy.       Right: No inguinal adenopathy present.       Left: No inguinal adenopathy present.  Neurological: She is alert and oriented to person, place, and time. She has normal reflexes.  Skin: Skin is warm, dry and intact. No rash noted. She is not diaphoretic. No erythema. No pallor.  Psychiatric: She has a normal mood and affect. Her speech is normal and behavior  is normal. Judgment and thought content normal. Cognition and memory are normal.  Nursing note and vitals reviewed.   Results for orders placed or performed in visit on 03/25/15  Microscopic Examination  Result Value Ref Range   WBC, UA None seen 0 -  5 /hpf   RBC, UA 3-10 (A) 0 -  2 /hpf   Epithelial Cells (non renal) 0-10 0 - 10 /hpf   Bacteria, UA Few None seen/Few  UA/M w/rflx Culture, Routine  Result Value Ref Range   Specific Gravity,  UA <1.005 (L) 1.005 - 1.030   pH, UA 5.5 5.0 - 7.5   Color, UA Yellow Yellow   Appearance Ur Clear Clear   Leukocytes, UA Negative Negative   Protein, UA Negative Negative/Trace   Glucose, UA Negative Negative   Ketones, UA Negative Negative   RBC, UA 2+ (A) Negative   Bilirubin, UA Negative Negative   Urobilinogen, Ur 0.2 0.2 - 1.0 mg/dL   Nitrite, UA Negative Negative   Microscopic Examination See below:   Hemoglobin A1c  Result Value Ref Range   Hemoglobin A1C 7.3       Assessment & Plan:   Problem List Items Addressed This Visit      Other   Tobacco abuse    Would like to try chantix. Rx sent to her pharmacy. Check in in 1 month to see how she's doing on her smoking.       Relevant Medications   varenicline (CHANTIX STARTING MONTH PAK) 0.5 MG X 11 & 1 MG X 42 tablet   varenicline (CHANTIX CONTINUING MONTH PAK) 1 MG tablet   Anxiety    Not under good control. Under a lot of stress with her son and some social issues. Would like to go back on prozac. Will start her on 20mg  daily and recheck in 1 month to see how she's doing.       Relevant Medications   FLUoxetine (PROZAC) 20 MG tablet    Other Visit Diagnoses    Routine general medical examination at a health care facility    -  Primary    Screening labs checked today. Mammogram ordered. Pap done today. Vaccines discussed and will be considered. Work on diet and exercise. Continue to monitor.     Cervical cancer screening        Pap done today. Await results.     Relevant  Orders    IGP, Aptima HPV, rfx 16/18,45        Follow up plan: Return in about 4 weeks (around 04/22/2015) for Follow up smoking and depression/anxiety.   LABORATORY TESTING:  - Pap smear: pap done  IMMUNIZATIONS:   - Tdap: Tetanus vaccination status reviewed: She will think about it and possibly get next visit. - Influenza: Up to date - Pneumovax: She will think about it and possibly get next visit  SCREENING: -Mammogram: Ordered today   PATIENT COUNSELING:   Advised to take 1 mg of folate supplement per day if capable of pregnancy.   Sexuality: Discussed sexually transmitted diseases, partner selection, use of condoms, avoidance of unintended pregnancy  and contraceptive alternatives.   Advised to avoid cigarette smoking.  I discussed with the patient that most people either abstain from alcohol or drink within safe limits (<=14/week and <=4 drinks/occasion for males, <=7/weeks and <= 3 drinks/occasion for females) and that the risk for alcohol disorders and other health effects rises proportionally with the number of drinks per week and how often a drinker exceeds daily limits.  Discussed cessation/primary prevention of drug use and availability of treatment for abuse.   Diet: Encouraged to adjust caloric intake to maintain  or achieve ideal body weight, to reduce intake of dietary saturated fat and total fat, to limit sodium intake by avoiding high sodium foods and not adding table salt, and to maintain adequate dietary potassium and calcium preferably from fresh fruits, vegetables, and low-fat dairy products.    stressed the importance of regular exercise  Injury prevention: Discussed safety belts, safety helmets, smoke detector,  smoking near bedding or upholstery.   Dental health: Discussed importance of regular tooth brushing, flossing, and dental visits.    NEXT PREVENTATIVE PHYSICAL DUE IN 1 YEAR. Return in about 4 weeks (around 04/22/2015) for Follow up smoking and  depression/anxiety.

## 2015-03-25 NOTE — Assessment & Plan Note (Signed)
Would like to try chantix. Rx sent to her pharmacy. Check in in 1 month to see how she's doing on her smoking.

## 2015-03-25 NOTE — Patient Instructions (Signed)
Health Maintenance, Female Adopting a healthy lifestyle and getting preventive care can go a long way to promote health and wellness. Talk with your health care provider about what schedule of regular examinations is right for you. This is a good chance for you to check in with your provider about disease prevention and staying healthy. In between checkups, there are plenty of things you can do on your own. Experts have done a lot of research about which lifestyle changes and preventive measures are most likely to keep you healthy. Ask your health care provider for more information. WEIGHT AND DIET  Eat a healthy diet  Be sure to include plenty of vegetables, fruits, low-fat dairy products, and lean protein.  Do not eat a lot of foods high in solid fats, added sugars, or salt.  Get regular exercise. This is one of the most important things you can do for your health.  Most adults should exercise for at least 150 minutes each week. The exercise should increase your heart rate and make you sweat (moderate-intensity exercise).  Most adults should also do strengthening exercises at least twice a week. This is in addition to the moderate-intensity exercise.  Maintain a healthy weight  Body mass index (BMI) is a measurement that can be used to identify possible weight problems. It estimates body fat based on height and weight. Your health care provider can help determine your BMI and help you achieve or maintain a healthy weight.  For females 20 years of age and older:   A BMI below 18.5 is considered underweight.  A BMI of 18.5 to 24.9 is normal.  A BMI of 25 to 29.9 is considered overweight.  A BMI of 30 and above is considered obese.  Watch levels of cholesterol and blood lipids  You should start having your blood tested for lipids and cholesterol at 48 years of age, then have this test every 5 years.  You may need to have your cholesterol levels checked more often if:  Your lipid  or cholesterol levels are high.  You are older than 48 years of age.  You are at high risk for heart disease.  CANCER SCREENING   Lung Cancer  Lung cancer screening is recommended for adults 55-80 years old who are at high risk for lung cancer because of a history of smoking.  A yearly low-dose CT scan of the lungs is recommended for people who:  Currently smoke.  Have quit within the past 15 years.  Have at least a 30-pack-year history of smoking. A pack year is smoking an average of one pack of cigarettes a day for 1 year.  Yearly screening should continue until it has been 15 years since you quit.  Yearly screening should stop if you develop a health problem that would prevent you from having lung cancer treatment.  Breast Cancer  Practice breast self-awareness. This means understanding how your breasts normally appear and feel.  It also means doing regular breast self-exams. Let your health care provider know about any changes, no matter how small.  If you are in your 20s or 30s, you should have a clinical breast exam (CBE) by a health care provider every 1-3 years as part of a regular health exam.  If you are 40 or older, have a CBE every year. Also consider having a breast X-ray (mammogram) every year.  If you have a family history of breast cancer, talk to your health care provider about genetic screening.  If you   are at high risk for breast cancer, talk to your health care provider about having an MRI and a mammogram every year.  Breast cancer gene (BRCA) assessment is recommended for women who have family members with BRCA-related cancers. BRCA-related cancers include:  Breast.  Ovarian.  Tubal.  Peritoneal cancers.  Results of the assessment will determine the need for genetic counseling and BRCA1 and BRCA2 testing. Cervical Cancer Your health care provider may recommend that you be screened regularly for cancer of the pelvic organs (ovaries, uterus, and  vagina). This screening involves a pelvic examination, including checking for microscopic changes to the surface of your cervix (Pap test). You may be encouraged to have this screening done every 3 years, beginning at age 21.  For women ages 30-65, health care providers may recommend pelvic exams and Pap testing every 3 years, or they may recommend the Pap and pelvic exam, combined with testing for human papilloma virus (HPV), every 5 years. Some types of HPV increase your risk of cervical cancer. Testing for HPV may also be done on women of any age with unclear Pap test results.  Other health care providers may not recommend any screening for nonpregnant women who are considered low risk for pelvic cancer and who do not have symptoms. Ask your health care provider if a screening pelvic exam is right for you.  If you have had past treatment for cervical cancer or a condition that could lead to cancer, you need Pap tests and screening for cancer for at least 20 years after your treatment. If Pap tests have been discontinued, your risk factors (such as having a new sexual partner) need to be reassessed to determine if screening should resume. Some women have medical problems that increase the chance of getting cervical cancer. In these cases, your health care provider may recommend more frequent screening and Pap tests. Colorectal Cancer  This type of cancer can be detected and often prevented.  Routine colorectal cancer screening usually begins at 48 years of age and continues through 48 years of age.  Your health care provider may recommend screening at an earlier age if you have risk factors for colon cancer.  Your health care provider may also recommend using home test kits to check for hidden blood in the stool.  A small camera at the end of a tube can be used to examine your colon directly (sigmoidoscopy or colonoscopy). This is done to check for the earliest forms of colorectal  cancer.  Routine screening usually begins at age 50.  Direct examination of the colon should be repeated every 5-10 years through 48 years of age. However, you may need to be screened more often if early forms of precancerous polyps or small growths are found. Skin Cancer  Check your skin from head to toe regularly.  Tell your health care provider about any new moles or changes in moles, especially if there is a change in a mole's shape or color.  Also tell your health care provider if you have a mole that is larger than the size of a pencil eraser.  Always use sunscreen. Apply sunscreen liberally and repeatedly throughout the day.  Protect yourself by wearing long sleeves, pants, a wide-brimmed hat, and sunglasses whenever you are outside. HEART DISEASE, DIABETES, AND HIGH BLOOD PRESSURE   High blood pressure causes heart disease and increases the risk of stroke. High blood pressure is more likely to develop in:  People who have blood pressure in the high end   of the normal range (130-139/85-89 mm Hg).  People who are overweight or obese.  People who are African American.  If you are 38-23 years of age, have your blood pressure checked every 3-5 years. If you are 61 years of age or older, have your blood pressure checked every year. You should have your blood pressure measured twice--once when you are at a hospital or clinic, and once when you are not at a hospital or clinic. Record the average of the two measurements. To check your blood pressure when you are not at a hospital or clinic, you can use:  An automated blood pressure machine at a pharmacy.  A home blood pressure monitor.  If you are between 45 years and 39 years old, ask your health care provider if you should take aspirin to prevent strokes.  Have regular diabetes screenings. This involves taking a blood sample to check your fasting blood sugar level.  If you are at a normal weight and have a low risk for diabetes,  have this test once every three years after 48 years of age.  If you are overweight and have a high risk for diabetes, consider being tested at a younger age or more often. PREVENTING INFECTION  Hepatitis B  If you have a higher risk for hepatitis B, you should be screened for this virus. You are considered at high risk for hepatitis B if:  You were born in a country where hepatitis B is common. Ask your health care provider which countries are considered high risk.  Your parents were born in a high-risk country, and you have not been immunized against hepatitis B (hepatitis B vaccine).  You have HIV or AIDS.  You use needles to inject street drugs.  You live with someone who has hepatitis B.  You have had sex with someone who has hepatitis B.  You get hemodialysis treatment.  You take certain medicines for conditions, including cancer, organ transplantation, and autoimmune conditions. Hepatitis C  Blood testing is recommended for:  Everyone born from [birth year] through 1963.  Anyone with known risk factors for hepatitis C. Sexually transmitted infections (STIs)  You should be screened for sexually transmitted infections (STIs) including gonorrhea and chlamydia if:  You are sexually active and are younger than 48 years of age.  You are older than 48 years of age and your health care provider tells you that you are at risk for this type of infection.  Your sexual activity has changed since you were last screened and you are at an increased risk for chlamydia or gonorrhea. Ask your health care provider if you are at risk.  If you do not have HIV, but are at risk, it may be recommended that you take a prescription medicine daily to prevent HIV infection. This is called pre-exposure prophylaxis (PrEP). You are considered at risk if:  You are sexually active and do not regularly use condoms or know the HIV status of your partner(s).  You take drugs by injection.  You are sexually  active with a partner who has HIV. Talk with your health care provider about whether you are at high risk of being infected with HIV. If you choose to begin PrEP, you should first be tested for HIV. You should then be tested every 3 months for as long as you are taking PrEP.  PREGNANCY   If you are premenopausal and you may become pregnant, ask your health care provider about preconception counseling.  If you may  become pregnant, take 400 to 800 micrograms (mcg) of folic acid every day.  If you want to prevent pregnancy, talk to your health care provider about birth control (contraception). OSTEOPOROSIS AND MENOPAUSE   Osteoporosis is a disease in which the bones lose minerals and strength with aging. This can result in serious bone fractures. Your risk for osteoporosis can be identified using a bone density scan.  If you are 65 years of age or older, or if you are at risk for osteoporosis and fractures, ask your health care provider if you should be screened.  Ask your health care provider whether you should take a calcium or vitamin D supplement to lower your risk for osteoporosis.  Menopause may have certain physical symptoms and risks.  Hormone replacement therapy may reduce some of these symptoms and risks. Talk to your health care provider about whether hormone replacement therapy is right for you.  HOME CARE INSTRUCTIONS   Schedule regular health, dental, and eye exams.  Stay current with your immunizations.   Do not use any tobacco products including cigarettes, chewing tobacco, or electronic cigarettes.  If you are pregnant, do not drink alcohol.  If you are breastfeeding, limit how much and how often you drink alcohol.  Limit alcohol intake to no more than 1 drink per day for nonpregnant women. One drink equals 12 ounces of beer, 5 ounces of wine, or 1 ounces of hard liquor.  Do not use street drugs.  Do not share needles.  Ask your health care provider for help if  you need support or information about quitting drugs.  Tell your health care provider if you often feel depressed.  Tell your health care provider if you have ever been abused or do not feel safe at home.   This information is not intended to replace advice given to you by your health care provider. Make sure you discuss any questions you have with your health care provider.   Document Released: 08/15/2010 Document Revised: 02/20/2014 Document Reviewed: 01/01/2013 Elsevier Interactive Patient Education 2016 [employer]  Smoking Cessation, Tips for Success If you are ready to quit smoking, congratulations! You have chosen to help yourself be healthier. Cigarettes bring nicotine, tar, carbon monoxide, and other irritants into your body. Your lungs, heart, and blood vessels will be able to work better without these poisons. There are many different ways to quit smoking. Nicotine gum, nicotine patches, a nicotine inhaler, or nicotine nasal spray can help with physical craving. Hypnosis, support groups, and medicines help break the habit of smoking. WHAT THINGS CAN I DO TO MAKE QUITTING EASIER?  Here are some tips to help you quit for good:  Pick a date when you will quit smoking completely. Tell all of your friends and family about your plan to quit on that date.  Do not try to slowly cut down on the number of cigarettes you are smoking. Pick a quit date and quit smoking completely starting on that day.  Throw away all cigarettes.   Clean and remove all ashtrays from your home, work, and car.  On a card, write down your reasons for quitting. Carry the card with you and read it when you get the urge to smoke.  Cleanse your body of nicotine. Drink enough water and fluids to keep your urine clear or pale yellow. Do this after quitting to flush the nicotine from your body.  Learn to predict your moods. Do not let a bad situation be your excuse to have   a cigarette. Some situations in your life  might tempt you into wanting a cigarette.  Never have "just one" cigarette. It leads to wanting another and another. Remind yourself of your decision to quit.  Change habits associated with smoking. If you smoked while driving or when feeling stressed, try other activities to replace smoking. Stand up when drinking your coffee. Brush your teeth after eating. Sit in a different chair when you read the paper. Avoid alcohol while trying to quit, and try to drink fewer caffeinated beverages. Alcohol and caffeine may urge you to smoke.  Avoid foods and drinks that can trigger a desire to smoke, such as sugary or spicy foods and alcohol.  Ask people who smoke not to smoke around you.  Have something planned to do right after eating or having a cup of coffee. For example, plan to take a walk or exercise.  Try a relaxation exercise to calm you down and decrease your stress. Remember, you may be tense and nervous for the first 2 weeks after you quit, but this will pass.  Find new activities to keep your hands busy. Play with a pen, coin, or rubber band. Doodle or draw things on paper.  Brush your teeth right after eating. This will help cut down on the craving for the taste of tobacco after meals. You can also try mouthwash.   Use oral substitutes in place of cigarettes. Try using lemon drops, carrots, cinnamon sticks, or chewing gum. Keep them handy so they are available when you have the urge to smoke.  When you have the urge to smoke, try deep breathing.  Designate your home as a nonsmoking area.  If you are a heavy smoker, ask your health care provider about a prescription for nicotine chewing gum. It can ease your withdrawal from nicotine.  Reward yourself. Set aside the cigarette money you save and buy yourself something nice.  Look for support from others. Join a support group or smoking cessation program. Ask someone at home or at work to help you with your plan to quit smoking.  Always  ask yourself, "Do I need this cigarette or is this just a reflex?" Tell yourself, "Today, I choose not to smoke," or "I do not want to smoke." You are reminding yourself of your decision to quit.  Do not replace cigarette smoking with electronic cigarettes (commonly called e-cigarettes). The safety of e-cigarettes is unknown, and some may contain harmful chemicals.  If you relapse, do not give up! Plan ahead and think about what you will do the next time you get the urge to smoke. HOW WILL I FEEL WHEN I QUIT SMOKING? You may have symptoms of withdrawal because your body is used to nicotine (the addictive substance in cigarettes). You may crave cigarettes, be irritable, feel very hungry, cough often, get headaches, or have difficulty concentrating. The withdrawal symptoms are only temporary. They are strongest when you first quit but will go away within 10-14 days. When withdrawal symptoms occur, stay in control. Think about your reasons for quitting. Remind yourself that these are signs that your body is healing and getting used to being without cigarettes. Remember that withdrawal symptoms are easier to treat than the major diseases that smoking can cause.  Even after the withdrawal is over, expect periodic urges to smoke. However, these cravings are generally short lived and will go away whether you smoke or not. Do not smoke! WHAT RESOURCES ARE AVAILABLE TO HELP ME QUIT SMOKING? Your health care   provider can direct you to community resources or hospitals for support, which may include:  Group support.  Education.  Hypnosis.  Therapy.   This information is not intended to replace advice given to you by your health care provider. Make sure you discuss any questions you have with your health care provider.   Document Released: 10/29/2003 Document Revised: 02/20/2014 Document Reviewed: 07/18/2012 Elsevier Interactive Patient Education 2016 [employer]  

## 2015-03-26 ENCOUNTER — Encounter: Payer: Self-pay | Admitting: Family Medicine

## 2015-03-26 LAB — CBC WITH DIFFERENTIAL/PLATELET
BASOS: 0 %
Basophils Absolute: 0 10*3/uL (ref 0.0–0.2)
EOS (ABSOLUTE): 0.2 10*3/uL (ref 0.0–0.4)
Eos: 2 %
HEMATOCRIT: 41.3 % (ref 34.0–46.6)
HEMOGLOBIN: 13.4 g/dL (ref 11.1–15.9)
IMMATURE GRANS (ABS): 0 10*3/uL (ref 0.0–0.1)
Immature Granulocytes: 0 %
LYMPHS: 37 %
Lymphocytes Absolute: 3.8 10*3/uL — ABNORMAL HIGH (ref 0.7–3.1)
MCH: 29.1 pg (ref 26.6–33.0)
MCHC: 32.4 g/dL (ref 31.5–35.7)
MCV: 90 fL (ref 79–97)
MONOCYTES: 7 %
Monocytes Absolute: 0.7 10*3/uL (ref 0.1–0.9)
NEUTROS ABS: 5.4 10*3/uL (ref 1.4–7.0)
Neutrophils: 54 %
Platelets: 286 10*3/uL (ref 150–379)
RBC: 4.6 x10E6/uL (ref 3.77–5.28)
RDW: 14.1 % (ref 12.3–15.4)
WBC: 10.2 10*3/uL (ref 3.4–10.8)

## 2015-03-26 LAB — COMPREHENSIVE METABOLIC PANEL
A/G RATIO: 1.4 (ref 1.1–2.5)
ALBUMIN: 3.8 g/dL (ref 3.5–5.5)
ALT: 17 IU/L (ref 0–32)
AST: 16 IU/L (ref 0–40)
Alkaline Phosphatase: 86 IU/L (ref 39–117)
BUN/Creatinine Ratio: 13 (ref 9–23)
BUN: 8 mg/dL (ref 6–24)
CO2: 24 mmol/L (ref 18–29)
CREATININE: 0.63 mg/dL (ref 0.57–1.00)
Calcium: 9.1 mg/dL (ref 8.7–10.2)
Chloride: 100 mmol/L (ref 96–106)
GFR calc Af Amer: 124 mL/min/{1.73_m2} (ref >59)
GFR, EST NON AFRICAN AMERICAN: 107 mL/min/{1.73_m2} (ref >59)
GLOBULIN, TOTAL: 2.8 g/dL (ref 1.5–4.5)
Glucose: 160 mg/dL — ABNORMAL HIGH (ref 65–99)
Potassium: 4.5 mmol/L (ref 3.5–5.2)
SODIUM: 139 mmol/L (ref 134–144)
TOTAL PROTEIN: 6.6 g/dL (ref 6.0–8.5)

## 2015-03-26 LAB — LIPID PANEL W/O CHOL/HDL RATIO
Cholesterol, Total: 147 mg/dL (ref 100–199)
HDL: 48 mg/dL (ref >39)
LDL CALC: 75 mg/dL (ref 0–99)
TRIGLYCERIDES: 119 mg/dL (ref 0–149)
VLDL CHOLESTEROL CAL: 24 mg/dL (ref 5–40)

## 2015-03-26 LAB — TSH: TSH: 0.839 u[IU]/mL (ref 0.450–4.500)

## 2015-03-31 ENCOUNTER — Encounter: Payer: Self-pay | Admitting: Family Medicine

## 2015-03-31 LAB — IGP, APTIMA HPV, RFX 16/18,45
HPV APTIMA: NEGATIVE
PAP Smear Comment: 0

## 2015-04-12 ENCOUNTER — Ambulatory Visit
Admission: RE | Admit: 2015-04-12 | Discharge: 2015-04-12 | Disposition: A | Discharge status: Home / Self Care | Payer: BLUE CROSS/BLUE SHIELD | Source: Ambulatory Visit | Attending: Family Medicine | Admitting: Family Medicine

## 2015-04-12 ENCOUNTER — Other Ambulatory Visit: Payer: Self-pay | Admitting: Family Medicine

## 2015-04-12 ENCOUNTER — Telehealth: Payer: Self-pay | Admitting: Family Medicine

## 2015-04-12 DIAGNOSIS — R918 Other nonspecific abnormal finding of lung field: Secondary | ICD-10-CM

## 2015-04-12 NOTE — Telephone Encounter (Signed)
Patient notified

## 2015-04-12 NOTE — Telephone Encounter (Signed)
Please let Brittany Zavala know her CT hasn't changed. We'll check again next year.

## 2015-04-21 ENCOUNTER — Telehealth: Payer: Self-pay | Admitting: Family Medicine

## 2015-04-21 NOTE — Telephone Encounter (Signed)
Pt called and stated that she was suppose to start taking prozac 4 weeks ago but she just stated last week. (FYI)--appt for tomorrow cancelled

## 2015-04-21 NOTE — Telephone Encounter (Signed)
Forward to provider

## 2015-04-22 ENCOUNTER — Ambulatory Visit: Payer: BLUE CROSS/BLUE SHIELD | Admitting: Family Medicine

## 2015-05-24 ENCOUNTER — Other Ambulatory Visit: Payer: Self-pay | Admitting: Family Medicine

## 2015-06-17 ENCOUNTER — Other Ambulatory Visit: Payer: Self-pay | Admitting: Family Medicine

## 2015-06-21 ENCOUNTER — Emergency Department: Payer: BLUE CROSS/BLUE SHIELD

## 2015-06-21 ENCOUNTER — Emergency Department
Admission: EM | Admit: 2015-06-21 | Discharge: 2015-06-22 | Disposition: A | Discharge status: Home / Self Care | Payer: BLUE CROSS/BLUE SHIELD | Attending: Emergency Medicine | Admitting: Emergency Medicine

## 2015-06-21 DIAGNOSIS — E109 Type 1 diabetes mellitus without complications: Secondary | ICD-10-CM | POA: Diagnosis not present

## 2015-06-21 DIAGNOSIS — F17210 Nicotine dependence, cigarettes, uncomplicated: Secondary | ICD-10-CM | POA: Insufficient documentation

## 2015-06-21 DIAGNOSIS — Z79899 Other long term (current) drug therapy: Secondary | ICD-10-CM | POA: Diagnosis not present

## 2015-06-21 DIAGNOSIS — R110 Nausea: Secondary | ICD-10-CM

## 2015-06-21 DIAGNOSIS — R42 Dizziness and giddiness: Secondary | ICD-10-CM | POA: Diagnosis not present

## 2015-06-21 DIAGNOSIS — I10 Essential (primary) hypertension: Secondary | ICD-10-CM | POA: Insufficient documentation

## 2015-06-21 DIAGNOSIS — I2510 Atherosclerotic heart disease of native coronary artery without angina pectoris: Secondary | ICD-10-CM | POA: Insufficient documentation

## 2015-06-21 DIAGNOSIS — F329 Major depressive disorder, single episode, unspecified: Secondary | ICD-10-CM | POA: Insufficient documentation

## 2015-06-21 DIAGNOSIS — E785 Hyperlipidemia, unspecified: Secondary | ICD-10-CM | POA: Diagnosis not present

## 2015-06-21 LAB — BASIC METABOLIC PANEL
ANION GAP: 9 (ref 5–15)
BUN: 10 mg/dL (ref 6–20)
CALCIUM: 9 mg/dL (ref 8.9–10.3)
CO2: 22 mmol/L (ref 22–32)
Chloride: 103 mmol/L (ref 101–111)
Creatinine, Ser: 0.92 mg/dL (ref 0.44–1.00)
Glucose, Bld: 210 mg/dL — ABNORMAL HIGH (ref 65–99)
Potassium: 4.1 mmol/L (ref 3.5–5.1)
Sodium: 134 mmol/L — ABNORMAL LOW (ref 135–145)

## 2015-06-21 LAB — GLUCOSE, CAPILLARY: GLUCOSE-CAPILLARY: 192 mg/dL — AB (ref 65–99)

## 2015-06-21 LAB — CBC
HCT: 39.2 % (ref 35.0–47.0)
Hemoglobin: 13.3 g/dL (ref 12.0–16.0)
MCH: 28.7 pg (ref 26.0–34.0)
MCHC: 33.9 g/dL (ref 32.0–36.0)
MCV: 84.7 fL (ref 80.0–100.0)
PLATELETS: 226 10*3/uL (ref 150–440)
RBC: 4.63 MIL/uL (ref 3.80–5.20)
RDW: 14.8 % — AB (ref 11.5–14.5)
WBC: 10.5 10*3/uL (ref 3.6–11.0)

## 2015-06-21 LAB — TROPONIN I: Troponin I: <0.03 ng/mL (ref <0.031)

## 2015-06-21 MED ORDER — DIPHENHYDRAMINE HCL 50 MG/ML IJ SOLN
25.0000 mg | Freq: Once | INTRAMUSCULAR | Status: AC
  Administered 2015-06-21: 25 mg via INTRAVENOUS
  Filled 2015-06-21: qty 1

## 2015-06-21 MED ORDER — SODIUM CHLORIDE 0.9 % IV BOLUS (SEPSIS)
1000.0000 mL | Freq: Once | INTRAVENOUS | Status: AC
  Administered 2015-06-21: 1000 mL via INTRAVENOUS

## 2015-06-21 MED ORDER — METOCLOPRAMIDE HCL 5 MG/ML IJ SOLN
10.0000 mg | Freq: Once | INTRAMUSCULAR | Status: DC
  Filled 2015-06-21: qty 2

## 2015-06-21 MED ORDER — KETOROLAC TROMETHAMINE 30 MG/ML IJ SOLN
30.0000 mg | Freq: Once | INTRAMUSCULAR | Status: DC
  Filled 2015-06-21: qty 1

## 2015-06-21 NOTE — ED Notes (Signed)
Patient states she has not felt well all day. Insulin pump was alarming this morning at 56, currently at 250. States checked her blood pressure at home and it was very elevated despite compliance with blood pressure medicines. States headache and not feeling well, nausea without vomiting. Patient is able to speak in complete sentences without difficulty. States intermittent CP that she describes as a twinge, not a grab your chest type of pain. Took aspirin because 911 told her to but that did not change anything.

## 2015-06-21 NOTE — ED Notes (Signed)
Pt A/Ox4, NAD.  Pt sts that since this AM pt sts that she has had episodes of feeling "flushed".  Pt denies pain w/ these episodes, just that "I don't feel right".  Pt sts that she had chgs to insulin 2 wks ago.  Sts that her CBGs have been regular this afternoon.

## 2015-06-21 NOTE — ED Notes (Signed)
Patient transported to CT 

## 2015-06-21 NOTE — ED Notes (Signed)
Patient to ED [facility] with complaints of a headache/nausea and intermittent CP. Took Tylenol and then 325 mg ASA because 911 told her to. Stated to EMS did not have CP at the time of their arrival, and that ASA did not change anything.

## 2015-06-21 NOTE — ED Notes (Addendum)
CBG blood glucose 192, Nellie RN notified of this reading

## 2015-06-22 LAB — TROPONIN I: Troponin I: <0.03 ng/mL (ref <0.031)

## 2015-06-22 NOTE — Discharge Instructions (Signed)
Dizziness Dizziness is a common problem. It is a feeling of unsteadiness or light-headedness. You may feel like you are about to faint. Dizziness can lead to injury if you stumble or fall. Anyone can become dizzy, but dizziness is more common in older adults. This condition can be caused by a number of things, including medicines, dehydration, or illness. HOME CARE INSTRUCTIONS Taking these steps may help with your condition: Eating and Drinking  Drink enough fluid to keep your urine clear or pale yellow. This helps to keep you from becoming dehydrated. Try to drink more clear fluids, such as water.  Do not drink alcohol.  Limit your caffeine intake if directed by your health care provider.  Limit your salt intake if directed by your health care provider. Activity  Avoid making quick movements.  Rise slowly from chairs and steady yourself until you feel okay.  In the morning, first sit up on the side of the bed. When you feel okay, stand slowly while you hold onto something until you know that your balance is fine.  Move your legs often if you need to stand in one place for a long time. Tighten and relax your muscles in your legs while you are standing.  Do not drive or operate heavy machinery if you feel dizzy.  Avoid bending down if you feel dizzy. Place items in your home so that they are easy for you to reach without leaning over. Lifestyle  Do not use any tobacco products, including cigarettes, chewing tobacco, or electronic cigarettes. If you need help quitting, ask your health care provider.  Try to reduce your stress level, such as with yoga or meditation. Talk with your health care provider if you need help. General Instructions  Watch your dizziness for any changes.  Take medicines only as directed by your health care provider. Talk with your health care provider if you think that your dizziness is caused by a medicine that you are taking.  Tell a friend or a family  member that you are feeling dizzy. If he or she notices any changes in your behavior, have this person call your health care provider.  Keep all follow-up visits as directed by your health care provider. This is important. SEEK MEDICAL CARE IF:  Your dizziness does not go away.  Your dizziness or light-headedness gets worse.  You feel nauseous.  You have reduced hearing.  You have new symptoms.  You are unsteady on your feet or you feel like the room is spinning. SEEK IMMEDIATE MEDICAL CARE IF:  You vomit or have diarrhea and are unable to eat or drink anything.  You have problems talking, walking, swallowing, or using your arms, hands, or legs.  You feel generally weak.  You are not thinking clearly or you have trouble forming sentences. It may take a friend or family member to notice this.  You have chest pain, abdominal pain, shortness of breath, or sweating.  Your vision changes.  You notice any bleeding.  You have a headache.  You have neck pain or a stiff neck.  You have a fever.   This information is not intended to replace advice given to you by your health care provider. Make sure you discuss any questions you have with your health care provider.   Document Released: 07/26/2000 Document Revised: 06/16/2014 Document Reviewed: 01/26/2014 Elsevier Interactive Patient Education 2016 [employer].  Hypertension Hypertension, commonly called high blood pressure, is when the force of blood pumping through your arteries is  too strong. Your arteries are the blood vessels that carry blood from your heart throughout your body. A blood pressure reading consists of a higher number over a lower number, such as 110/72. The higher number (systolic) is the pressure inside your arteries when your heart pumps. The lower number (diastolic) is the pressure inside your arteries when your heart relaxes. Ideally you want your blood pressure below 120/80. Hypertension forces your heart  to work harder to pump blood. Your arteries may become narrow or stiff. Having untreated or uncontrolled hypertension can cause heart attack, stroke, kidney disease, and other problems. RISK FACTORS Some risk factors for high blood pressure are controllable. Others are not.  Risk factors you cannot control include:   Race. You may be at higher risk if you are African American.  Age. Risk increases with age.  Gender. Men are at higher risk than women before age 18 years. After age 22, women are at higher risk than men. Risk factors you can control include:  Not getting enough exercise or physical activity.  Being overweight.  Getting too much fat, sugar, calories, or salt in your diet.  Drinking too much alcohol. SIGNS AND SYMPTOMS Hypertension does not usually cause signs or symptoms. Extremely high blood pressure (hypertensive crisis) may cause headache, anxiety, shortness of breath, and nosebleed. DIAGNOSIS To check if you have hypertension, your health care provider will measure your blood pressure while you are seated, with your arm held at the level of your heart. It should be measured at least twice using the same arm. Certain conditions can cause a difference in blood pressure between your right and left arms. A blood pressure reading that is higher than normal on one occasion does not mean that you need treatment. If it is not clear whether you have high blood pressure, you may be asked to return on a different day to have your blood pressure checked again. Or, you may be asked to monitor your blood pressure at home for 1 or more weeks. TREATMENT Treating high blood pressure includes making lifestyle changes and possibly taking medicine. Living a healthy lifestyle can help lower high blood pressure. You may need to change some of your habits. Lifestyle changes may include:  Following the DASH diet. This diet is high in fruits, vegetables, and whole grains. It is low in salt, red  meat, and added sugars.  Keep your sodium intake below 2,300 mg per day.  Getting at least 30-45 minutes of aerobic exercise at least 4 times per week.  Losing weight if necessary.  Not smoking.  Limiting alcoholic beverages.  Learning ways to reduce stress. Your health care provider may prescribe medicine if lifestyle changes are not enough to get your blood pressure under control, and if one of the following is true:  You are 78-68 years of age and your systolic blood pressure is above 140.  You are 43 years of age or older, and your systolic blood pressure is above 150.  Your diastolic blood pressure is above 90.  You have diabetes, and your systolic blood pressure is over [id number] or your diastolic blood pressure is over 90.  You have kidney disease and your blood pressure is above 140/90.  You have heart disease and your blood pressure is above 140/90. Your personal target blood pressure may vary depending on your medical conditions, your age, and other factors. HOME CARE INSTRUCTIONS  Have your blood pressure rechecked as directed by your health care provider.   Take medicines  only as directed by your health care provider. Follow the directions carefully. Blood pressure medicines must be taken as prescribed. The medicine does not work as well when you skip doses. Skipping doses also puts you at risk for problems.  Do not smoke.   Monitor your blood pressure at home as directed by your health care provider. SEEK MEDICAL CARE IF:   You think you are having a reaction to medicines taken.  You have recurrent headaches or feel dizzy.  You have swelling in your ankles.  You have trouble with your vision. SEEK IMMEDIATE MEDICAL CARE IF:  You develop a severe headache or confusion.  You have unusual weakness, numbness, or feel faint.  You have severe chest or abdominal pain.  You vomit repeatedly.  You have trouble breathing. MAKE SURE YOU:   Understand these  instructions.  Will watch your condition.  Will get help right away if you are not doing well or get worse.   This information is not intended to replace advice given to you by your health care provider. Make sure you discuss any questions you have with your health care provider.   Document Released: 01/30/2005 Document Revised: 06/16/2014 Document Reviewed: 11/22/2012 Elsevier Interactive Patient Education 2016 Elsevier Inc.  Nausea, Adult Nausea is the feeling that you have an upset stomach or have to vomit. Nausea by itself is not likely a serious concern, but it may be an early sign of more serious medical problems. As nausea gets worse, it can lead to vomiting. If vomiting develops, there is the risk of dehydration.  CAUSES   Viral infections.  Food poisoning.  Medicines.  Pregnancy.  Motion sickness.  Migraine headaches.  Emotional distress.  Severe pain from any source.  Alcohol intoxication. HOME CARE INSTRUCTIONS  Get plenty of rest.  Ask your caregiver about specific rehydration instructions.  Eat small amounts of food and sip liquids more often.  Take all medicines as told by your caregiver. SEEK MEDICAL CARE IF:  You have not improved after 2 days, or you get worse.  You have a headache. SEEK IMMEDIATE MEDICAL CARE IF:   You have a fever.  You faint.  You keep vomiting or have blood in your vomit.  You are extremely weak or dehydrated.  You have dark or bloody stools.  You have severe chest or abdominal pain. MAKE SURE YOU:  Understand these instructions.  Will watch your condition.  Will get help right away if you are not doing well or get worse.   This information is not intended to replace advice given to you by your health care provider. Make sure you discuss any questions you have with your health care provider.   Document Released: 03/09/2004 Document Revised: 02/20/2014 Document Reviewed: 10/12/2010 Elsevier Interactive Patient  Education [employer]2016 Elsevier Inc.

## 2015-06-22 NOTE — ED Provider Notes (Signed)
[facility] Emergency Department Provider Note   ____________________________________________  Time seen: Approximately 2316 AM  I have reviewed the triage vital signs and the nursing notes.   HISTORY  Chief Complaint Headache    HPI Brittany Zavala is a 50 y.o. female comes into the hospital today not feeling well. She reports that she has been feeling dizzy and nauseous. She reports that she checked her blood pressure and it was up in the 190s systolic. She felt flushed and tingling in her head. The patient's symptoms started last night. They've been coming and going. Currently the patient is not having any symptoms. She had a bad headache earlier when it occurred but is gone at this point. She's never had problems with headaches in the past. She took some Tylenol and Zofran. She denies any sick contacts denies any vomiting, diarrhea, abdominal pain. She did have some chest pain as well but she reports it was a twins that occurred for a second and then disappeared. She's had a history with her blood pressure but reports that she's been taking her medications without any problems. The patient denies any fevers. The patient reports that she just does not feel right so she came in to the hospital to get checked out.   Past Medical History  Diagnosis Date  . Hyperlipidemia   . Hypertension   . Depression   . Anxiety   . Insomnia   . CAD (coronary artery disease)   . Chronic UTI   . Gallbladder polyp March 2016    needs f/u scan in Sept 2016  . Type 1 diabetes mellitus (HCC)   . Hematuria   . Insomnia   . Tobacco abuse   . Gall bladder polyp 3/16    On UA- needs follow up scan 6 months later, (9/16)  . Furuncle of right axilla   . Current use of insulin (HCC)   . ANA positive Oct 2015    1:160 speckled    Patient Active Problem List   Diagnosis Date Noted  . Family history of pheochromocytoma 01/22/2015  . Pulmonary nodules/lesions,  multiple 01/11/2015  . Hypoglycemia associated with diabetes (HCC) 08/27/2014  . Acute hemorrhagic enterocolitis (HCC) 08/27/2014  . Leukocytosis 08/27/2014  . Abdominal pain 08/27/2014  . Campylobacter enteritis 08/27/2014  . GI bleed 08/25/2014  . Anxiety 08/25/2014  . HLD (hyperlipidemia) 08/25/2014  . HTN (hypertension) 08/25/2014  . GERD (gastroesophageal reflux disease) 08/25/2014  . Type 1 diabetes (HCC) 08/25/2014  . Gallbladder polyp 07/16/2014  . Tobacco abuse   . Current use of insulin (HCC)   . Insomnia   . CAD (coronary artery disease)   . Gallbladder polyp 04/14/2014  . ANA positive 11/13/2013    Past Surgical History  Procedure Laterality Date  . Cesarean section    . Tonsilectomy/adenoidectomy with myringotomy    . Nasal sinus surgery    . Appendectomy    . Cesarean section      x2    Current Outpatient Rx  Name  Route  Sig  Dispense  Refill  . albuterol (PROVENTIL) (2.5 MG/3ML) 0.083% nebulizer solution   Nebulization   Take 3 mLs (2.5 mg total) by nebulization every 6 (six) hours as needed for wheezing or shortness of breath.   150 mL   1   . BD PEN NEEDLE NANO U/F 32G X 4 MM MISC      See admin instructions. Use as directed.  Dispense as written.   Marland Kitchen. FLUoxetine (PROZAC) 20 MG capsule      TAKE ONE CAPSULE DAILY   30 capsule   6   . hydrOXYzine (ATARAX/VISTARIL) 25 MG tablet      TAKE ONE TABLET AT BEDTIME AS NEEDED   30 tablet   7   . Insulin Human (INSULIN PUMP) SOLN   Subcutaneous   Inject into the skin continuous. Use as directed continuously.         . metoprolol succinate (TOPROL-XL) 25 MG 24 hr tablet   Oral   Take 1.5 tablets (37.5 mg total) by mouth daily.   45 tablet   6   . nicotine (NICOTROL) 10 MG inhaler   Inhalation   Inhale 1 cartridge (1 continuous puffing total) into the lungs as needed for smoking cessation.   42 each   0   . omeprazole (PRILOSEC) 20 MG capsule      TAKE ONE CAPSULE BY MOUTH  ONCE DAILY   30 capsule   6   . ONE TOUCH ULTRA TEST test strip   Other   1 each by Other route 4 (four) times daily.            Dispense as written.   Marland Kitchen. PREVIDENT 5000 BOOSTER PLUS 1.1 % PSTE   Oral   Take 1 application by mouth 2 (two) times daily.            Dispense as written.   . quinapril (ACCUPRIL) 40 MG tablet      TAKE ONE TABLET BY MOUTH DAILY   30 tablet   6   . rosuvastatin (CRESTOR) 10 MG tablet      TAKE ONE TABLET AT BEDTIME FOR CHOLESTEROL   30 tablet   6   . varenicline (CHANTIX CONTINUING MONTH PAK) 1 MG tablet   Oral   Take 1 tablet (1 mg total) by mouth 2 (two) times daily.   60 tablet   2   . varenicline (CHANTIX STARTING MONTH PAK) 0.5 MG X 11 & 1 MG X 42 tablet      Take one 0.5 mg tablet by mouth once daily for 3 days, then increase to one 0.5 mg tablet BID for 4 days, then increase to one 1mg  tab BID.   53 tablet   0     Allergies Morphine and related; Sulfa antibiotics; Tramadol; Vicodin; and Septra  Family History  Problem Relation Age of Onset  . Cancer Mother     breast  . Stroke Mother   . Dementia Mother   . Addison's disease Mother   . Diabetes Mother   . Heart disease Mother   . Heart disease Father   . Cancer Sister     breast  . Asthma Daughter   . Allergies Daughter     Social History Social History  Substance Use Topics  . Smoking status: Current Every Day Smoker -- 1.00 packs/day    Types: Cigarettes  . Smokeless tobacco: Never Used  . Alcohol Use: No    Review of Systems Constitutional: Last feeling Eyes: No visual changes. ENT: No sore throat. Cardiovascular:  chest pain. Respiratory: Denies shortness of breath. Gastrointestinal: No abdominal pain.  No nausea, no vomiting.  No diarrhea.  No constipation. Genitourinary: Negative for dysuria. Musculoskeletal: Negative for back pain. Skin: Negative for rash. Neurological: Dizziness  10-point ROS otherwise  negative.  ____________________________________________   PHYSICAL EXAM:  VITAL SIGNS: ED Triage Vitals  Enc Vitals Group  BP 06/21/15 2017 188/94 mmHg     Pulse Rate 06/21/15 2017 74     Resp 06/21/15 2017 20     Temp 06/21/15 2017 98.5 F (36.9 C)     Temp Source 06/21/15 2017 Oral     SpO2 06/21/15 2017 99 %     Weight 06/21/15 2017 230 lb (104.327 kg)     Height 06/21/15 2017 5\' 8"  (1.727 m)     Head Cir --      Peak Flow --      Pain Score 06/21/15 2018 1     Pain Loc --      Pain Edu? --      Excl. in GC? --     Constitutional: Alert and oriented. Well appearing and in mild distress. Eyes: Conjunctivae are normal. PERRL. EOMI. Head: Atraumatic. Nose: No congestion/rhinnorhea. Mouth/Throat: Mucous membranes are moist.  Oropharynx non-erythematous. Cardiovascular: Normal rate, regular rhythm. Grossly normal heart sounds.  Good peripheral circulation. Respiratory: Normal respiratory effort.  No retractions. Lungs CTAB. Gastrointestinal: Soft and nontender. No distention. Positive bowel sounds Musculoskeletal: No lower extremity tenderness nor edema.   Neurologic:  Normal speech and language. Cranial nerves II through XII are grossly intact with no focal motor or neuro deficits.  Skin:  Skin is warm, dry and intact.  Psychiatric: Mood and affect are normal.   ____________________________________________   LABS (all labs ordered are listed, but only abnormal results are displayed)  Labs Reviewed  CBC - Abnormal; Notable for the following:    RDW 14.8 (*)    All other components within normal limits  BASIC METABOLIC PANEL - Abnormal; Notable for the following:    Sodium 134 (*)    Glucose, Bld 210 (*)    All other components within normal limits  GLUCOSE, CAPILLARY - Abnormal; Notable for the following:    Glucose-Capillary 192 (*)    All other components within normal limits  TROPONIN I  TROPONIN I    ____________________________________________  EKG  ED ECG REPORT I, Rebecka Apley, the attending physician, personally viewed and interpreted this ECG.   Date: 06/21/2015  EKG Time: 2027  Rate: 75  Rhythm: normal sinus rhythm  Axis: normal  Intervals:none  ST&T Change: none  ____________________________________________  RADIOLOGY  CT head: No acute intracranial pathology. ____________________________________________   PROCEDURES  Procedure(s) performed: None  Critical Care performed: No  ____________________________________________   INITIAL IMPRESSION / ASSESSMENT AND PLAN / ED COURSE  Pertinent labs & imaging results that were available during my care of the patient were reviewed by me and considered in my medical decision making (see chart for details).  This is a 50 year old female who comes into the hospital today with some dizziness, nausea, headaches and feeling flushed. I informed the patient that her blood work was unremarkable but I did order a CT scan and repeat troponin. I ordered some Reglan, Benadryl and Toradol as well as a liter of normal saline for the patient but she refused that Reglan and the Toradol. She reports that her symptoms are resolved at this point and her blood pressure is better. I discussed with the patient there is a possibility she could have a tumor called a pheochromocytoma. She asked if that was an adrenal tumor and I told her it is commonly located in the adrenal glands and the patient reports that her mother had a similar tumor. She reports that she's had her urine checked by her doctor 6 months ago but she was  not having the symptoms. I will reassess the patient once I received the results of the CT scan and disposition the patient. I will recommend a 24-hour urine collection for urine metanephrines to the patient's primary care physician.  The patient's CT scan and blood work is unremarkable. She'll be discharged home to  follow-up with her primary care physician for further evaluation. ____________________________________________   FINAL CLINICAL IMPRESSION(S) / ED DIAGNOSES  Final diagnoses:  Dizziness  Nausea  Essential hypertension      NEW MEDICATIONS STARTED DURING THIS VISIT:  Discharge Medication List as of 06/22/2015  1:45 AM       Note:  This document was prepared using Dragon voice recognition software and may include unintentional dictation errors.    Rebecka Apley, MD 06/22/15 [phone]

## 2015-06-24 ENCOUNTER — Ambulatory Visit (INDEPENDENT_AMBULATORY_CARE_PROVIDER_SITE_OTHER): Payer: BLUE CROSS/BLUE SHIELD | Admitting: Family Medicine

## 2015-06-24 ENCOUNTER — Encounter: Payer: Self-pay | Admitting: Family Medicine

## 2015-06-24 VITALS — BP 162/78 | HR 79 | Temp 98.7°F | Wt 240.0 lb

## 2015-06-24 DIAGNOSIS — R238 Other skin changes: Secondary | ICD-10-CM | POA: Diagnosis not present

## 2015-06-24 DIAGNOSIS — I10 Essential (primary) hypertension: Secondary | ICD-10-CM

## 2015-06-24 DIAGNOSIS — Z8489 Family history of other specified conditions: Secondary | ICD-10-CM | POA: Diagnosis not present

## 2015-06-24 DIAGNOSIS — R233 Spontaneous ecchymoses: Secondary | ICD-10-CM

## 2015-06-24 LAB — CBC WITH DIFFERENTIAL/PLATELET
HEMOGLOBIN: 13.5 g/dL (ref 11.1–15.9)
Hematocrit: 38.9 % (ref 34.0–46.6)
LYMPHS ABS: 3 10*3/uL (ref 0.7–3.1)
Lymphs: 33 %
MCH: 30.5 pg (ref 26.6–33.0)
MCHC: 34.7 g/dL (ref 31.5–35.7)
MCV: 88 fL (ref 79–97)
MID (ABSOLUTE): 0.6 10*3/uL (ref 0.1–1.6)
MID: 7 %
NEUTROS ABS: 5.4 10*3/uL (ref 1.4–7.0)
Neutrophils: 60 %
Platelets: 221 10*3/uL (ref 150–379)
RBC: 4.42 x10E6/uL (ref 3.77–5.28)
RDW: 14.7 % (ref 12.3–15.4)
WBC: 9 10*3/uL (ref 3.4–10.8)

## 2015-06-24 LAB — COAGUCHEK XS/INR WAIVED
INR: 1 (ref 0.9–1.1)
PROTHROMBIN TIME: 11.6 s

## 2015-06-24 MED ORDER — AMLODIPINE BESYLATE 2.5 MG PO TABS
2.5000 mg | ORAL_TABLET | Freq: Every day | ORAL | Status: DC
Start: 2015-06-24 — End: 2015-07-06

## 2015-06-24 NOTE — Progress Notes (Signed)
BP 162/78 mmHg  Pulse 79  Temp(Src) 98.7 F (37.1 C)  Wt 240 lb (108.863 kg)  SpO2 98%  LMP 06/14/2015   Subjective:    Patient ID: Brittany Zavala, female    DOB: 01/19/1966, 50 y.o.   MRN: [id number]  HPI: Brittany Zavala is a 50 y.o. female  Chief Complaint  Patient presents with  . ER follow Up    Adrenal tumor was mentioned while in the ER   ER FOLLOW UP Time since discharge: 2 days Hospital/facility: [facility] Diagnosis: ? Pheo, HTN Procedures/tests: EKG, CT head, blood work Consultants: None New medications: None Discharge instructions: Follow up here, 24 hour urine metanephrines  Status: worse   Has been bruising really easily with very light scratching. Doesn't know where that is coming from. Also concerned that she may have lupus as ANA has been positive off and on in the past and son is having issue with butterfly rash now.   HYPERTENSION Hypertension status: exacerbated  Satisfied with current treatment? no Duration of hypertension: chronic BP monitoring frequency:  not checking BP medication side effects:  no Medication compliance: excellent compliance Aspirin: yes Recurrent headaches: yes Visual changes: yes Palpitations: yes Dyspnea: no Chest pain: no Lower extremity edema: no Dizzy/lightheaded: no   Relevant past medical, surgical, family and social history reviewed and updated as indicated. Interim medical history since our last visit reviewed. Allergies and medications reviewed and updated.  Review of Systems  Constitutional: Negative.   Respiratory: Negative.   Cardiovascular: Positive for palpitations. Negative for chest pain and leg swelling.  Hematological: Bruises/bleeds easily.  Psychiatric/Behavioral: Negative.     Per HPI unless specifically indicated above     Objective:    BP 162/78 mmHg  Pulse 79  Temp(Src) 98.7 F (37.1 C)  Wt 240 lb (108.863 kg)  SpO2 98%  LMP 06/14/2015  Wt Readings from Last 3  Encounters:  06/24/15 240 lb (108.863 kg)  06/21/15 230 lb (104.327 kg)  03/25/15 228 lb (103.42 kg)    Physical Exam  Constitutional: She is oriented to person, place, and time. She appears well-developed and well-nourished. No distress.  HENT:  Head: Normocephalic and atraumatic.  Right Ear: Hearing normal.  Left Ear: Hearing normal.  Nose: Nose normal.  Eyes: Conjunctivae and lids are normal. Right eye exhibits no discharge. Left eye exhibits no discharge. No scleral icterus.  Cardiovascular: Normal rate, regular rhythm, normal heart sounds and intact distal pulses.  Exam reveals no gallop and no friction rub.   No murmur heard. Pulmonary/Chest: Effort normal and breath sounds normal. No respiratory distress. She has no wheezes. She has no rales. She exhibits no tenderness.  Musculoskeletal: Normal range of motion.  Neurological: She is alert and oriented to person, place, and time.  Skin: Skin is warm, dry and intact. No rash noted. No erythema. No pallor.  Significant bruising on L antecubital fossa  Psychiatric: She has a normal mood and affect. Her speech is normal and behavior is normal. Judgment and thought content normal. Cognition and memory are normal.  Nursing note and vitals reviewed.   Results for orders placed or performed during the hospital encounter of 06/21/15  CBC  Result Value Ref Range   WBC 10.5 3.6 - 11.0 K/uL   RBC 4.63 3.80 - 5.20 MIL/uL   Hemoglobin 13.3 12.0 - 16.0 g/dL   HCT 39.2 35.0 - 47.0 %   MCV 84.7 80.0 - 100.0 fL   MCH 28.7 26.0 - 34.0 pg     MCHC 33.9 32.0 - 36.0 g/dL   RDW 14.8 (H) 11.5 - 14.5 %   Platelets 226 150 - 440 K/uL  Basic metabolic panel  Result Value Ref Range   Sodium 134 (L) 135 - 145 mmol/L   Potassium 4.1 3.5 - 5.1 mmol/L   Chloride 103 101 - 111 mmol/L   CO2 22 22 - 32 mmol/L   Glucose, Bld 210 (H) 65 - 99 mg/dL   BUN 10 6 - 20 mg/dL   Creatinine, Ser 0.92 0.44 - 1.00 mg/dL   Calcium 9.0 8.9 - 10.3 mg/dL   GFR calc  non Af Amer >60 >60 mL/min   GFR calc Af Amer >60 >60 mL/min   Anion gap 9 5 - 15  Troponin I  Result Value Ref Range   Troponin I <0.03 <0.031 ng/mL  Glucose, capillary  Result Value Ref Range   Glucose-Capillary 192 (H) 65 - 99 mg/dL   Comment 1 Notify RN    Comment 2 Document in Chart   Troponin I  Result Value Ref Range   Troponin I <0.03 <0.031 ng/mL      Assessment & Plan:   Problem List Items Addressed This Visit      Cardiovascular and Mediastinum   HTN (hypertension) - Primary    Will add amlodipine 1.25 to start, then increase to 2.5, recheck 1-2 weeks. Call with any concerns. Will check urine metanephrines again.       Relevant Medications   amLODipine (NORVASC) 2.5 MG tablet   Other Relevant Orders   Metanephrines, Urine, 24 hour     Other   Family history of pheochromocytoma    Didn't get full 24 hours last check- will recheck today. Kit given. Will start anti-histamine daily after collection.      Relevant Orders   Metanephrines, Urine, 24 hour    Other Visit Diagnoses    Easy bruising        CBC, CMP and INR checked today. Will check ANA again as well. Await results.     Relevant Medications    amLODipine (NORVASC) 2.5 MG tablet    Other Relevant Orders    CoaguChek XS/INR Waived    CBC With Differential/Platelet    Comprehensive metabolic panel    Antinuclear Antib (ANA)        Follow up plan: Return 10-14 days.

## 2015-06-24 NOTE — Assessment & Plan Note (Signed)
Will add amlodipine 1.25 to start, then increase to 2.5, recheck 1-2 weeks. Call with any concerns. Will check urine metanephrines again.

## 2015-06-24 NOTE — Assessment & Plan Note (Addendum)
Didn't get full 24 hours last check- will recheck today. Kit given. Will start anti-histamine daily after collection. 

## 2015-06-25 ENCOUNTER — Other Ambulatory Visit: Payer: Self-pay

## 2015-06-25 ENCOUNTER — Encounter: Payer: Self-pay | Admitting: *Deleted

## 2015-06-25 DIAGNOSIS — E785 Hyperlipidemia, unspecified: Secondary | ICD-10-CM | POA: Insufficient documentation

## 2015-06-25 DIAGNOSIS — I2510 Atherosclerotic heart disease of native coronary artery without angina pectoris: Secondary | ICD-10-CM | POA: Insufficient documentation

## 2015-06-25 DIAGNOSIS — F17210 Nicotine dependence, cigarettes, uncomplicated: Secondary | ICD-10-CM | POA: Diagnosis not present

## 2015-06-25 DIAGNOSIS — I10 Essential (primary) hypertension: Secondary | ICD-10-CM | POA: Diagnosis not present

## 2015-06-25 DIAGNOSIS — E109 Type 1 diabetes mellitus without complications: Secondary | ICD-10-CM | POA: Diagnosis not present

## 2015-06-25 DIAGNOSIS — Z79899 Other long term (current) drug therapy: Secondary | ICD-10-CM | POA: Insufficient documentation

## 2015-06-25 DIAGNOSIS — Z794 Long term (current) use of insulin: Secondary | ICD-10-CM | POA: Insufficient documentation

## 2015-06-25 DIAGNOSIS — F329 Major depressive disorder, single episode, unspecified: Secondary | ICD-10-CM | POA: Diagnosis not present

## 2015-06-25 LAB — COMPREHENSIVE METABOLIC PANEL
ALBUMIN: 3.9 g/dL (ref 3.5–5.5)
ALK PHOS: 76 U/L (ref 38–126)
ALT: 16 IU/L (ref 0–32)
ALT: 18 U/L (ref 14–54)
AST: 19 IU/L (ref 0–40)
AST: 24 U/L (ref 15–41)
Albumin/Globulin Ratio: 1.4 (ref 1.2–2.2)
Albumin: 4 g/dL (ref 3.5–5.0)
Alkaline Phosphatase: 82 IU/L (ref 39–117)
Anion gap: 6 (ref 5–15)
BILIRUBIN TOTAL: 0.2 mg/dL (ref 0.0–1.2)
BILIRUBIN TOTAL: 0.1 mg/dL — AB (ref 0.3–1.2)
BUN / CREAT RATIO: 8 — AB (ref 9–23)
BUN: 6 mg/dL (ref 6–24)
BUN: 7 mg/dL (ref 6–20)
CALCIUM: 8.9 mg/dL (ref 8.7–10.2)
CHLORIDE: 102 mmol/L (ref 96–106)
CO2: 22 mmol/L (ref 18–29)
CO2: 27 mmol/L (ref 22–32)
CREATININE: 0.77 mg/dL (ref 0.57–1.00)
Calcium: 9.2 mg/dL (ref 8.9–10.3)
Chloride: 106 mmol/L (ref 101–111)
Creatinine, Ser: 0.75 mg/dL (ref 0.44–1.00)
GFR calc Af Amer: >60 mL/min (ref >60)
GFR calc non Af Amer: >60 mL/min (ref >60)
GFR, EST AFRICAN AMERICAN: 106 mL/min/{1.73_m2} (ref >59)
GFR, EST NON AFRICAN AMERICAN: 92 mL/min/{1.73_m2} (ref >59)
GLUCOSE: 90 mg/dL (ref 65–99)
Globulin, Total: 2.8 g/dL (ref 1.5–4.5)
Glucose: 145 mg/dL — ABNORMAL HIGH (ref 65–99)
POTASSIUM: 4.2 mmol/L (ref 3.5–5.1)
Potassium: 4.1 mmol/L (ref 3.5–5.2)
SODIUM: 139 mmol/L (ref 135–145)
Sodium: 139 mmol/L (ref 134–144)
TOTAL PROTEIN: 6.7 g/dL (ref 6.0–8.5)
TOTAL PROTEIN: 7.4 g/dL (ref 6.5–8.1)

## 2015-06-25 LAB — CBC WITH DIFFERENTIAL/PLATELET
BASOS PCT: 1 %
Basophils Absolute: 0.1 10*3/uL (ref 0–0.1)
Eosinophils Absolute: 0.2 10*3/uL (ref 0–0.7)
Eosinophils Relative: 2 %
HEMATOCRIT: 39.8 % (ref 35.0–47.0)
HEMOGLOBIN: 13.3 g/dL (ref 12.0–16.0)
LYMPHS ABS: 3.8 10*3/uL — AB (ref 1.0–3.6)
LYMPHS PCT: 42 %
MCH: 29.3 pg (ref 26.0–34.0)
MCHC: 33.5 g/dL (ref 32.0–36.0)
MCV: 87.5 fL (ref 80.0–100.0)
MONO ABS: 0.7 10*3/uL (ref 0.2–0.9)
MONOS PCT: 8 %
NEUTROS ABS: 4.3 10*3/uL (ref 1.4–6.5)
NEUTROS PCT: 47 %
PLATELETS: 216 10*3/uL (ref 150–440)
RBC: 4.55 MIL/uL (ref 3.80–5.20)
RDW: 14.6 % — ABNORMAL HIGH (ref 11.5–14.5)
WBC: 9 10*3/uL (ref 3.6–11.0)

## 2015-06-25 LAB — ANA: Anti Nuclear Antibody(ANA): NEGATIVE

## 2015-06-25 LAB — TROPONIN I: Troponin I: <0.03 ng/mL (ref <0.031)

## 2015-06-25 NOTE — ED Notes (Signed)
Pt to triage via wheelchair.  Pt reports elevated blood pressure for 4 days.  Pt started new blood pressure med yesterday.   Pt has a headache.  Pt reports nausea.  Pt alert.   Speech is clear.

## 2015-06-26 ENCOUNTER — Emergency Department
Admission: EM | Admit: 2015-06-26 | Discharge: 2015-06-26 | Disposition: A | Discharge status: Home / Self Care | Payer: BLUE CROSS/BLUE SHIELD | Attending: Emergency Medicine | Admitting: Emergency Medicine

## 2015-06-26 DIAGNOSIS — I10 Essential (primary) hypertension: Secondary | ICD-10-CM

## 2015-06-26 NOTE — ED Provider Notes (Signed)
[facility] Emergency Department Provider Note   ____________________________________________  Time seen: Approximately 12:50 AM I have reviewed the triage vital signs and the triage nursing note.  HISTORY  Chief Complaint Hypertension   Historian Patient and spouse  HPI Brittany Zavala is a 50 y.o. female who is here for evaluation of an episode of blood pressure of 220/120 home. She has been treated for hypertension for what sounds like years on quinapril given that she is a type I diabetic. However recently she has had metoprolol added over the last several months for increasing blood pressures. It sounds like over the past week or so she's had several episodes where her blood pressure spikes very high and she checks it at home and in the 220s over 120s and she has symptoms of hot flash and maybe some slight nausea and head pressure. No chest pain, palpitations, trouble breathing, fever, focal weakness or numbness, or speech or word finding problems.  Her primary care physician is obtaining a 24 hour urine test to look for possible pheochromocytoma. Of note the patient's mom was diagnosed and had a pheochromocytoma.  Yesterday she was started on additional blood pressure medication, Norvasc 2.5 mg tablets. She was instructed to take half tablet, 1.25 mg for couple of days and then increase if still having high blood pressures.  She was not going to increase her metoprolol due to heart rate in the 60s.  She reports she had been taking Benadryl for these episodes of high blood pressure which seemed to work, but she's been off this in preparation for the urine collection.    Past Medical History  Diagnosis Date  . Hyperlipidemia   . Hypertension   . Depression   . Anxiety   . Insomnia   . CAD (coronary artery disease)   . Chronic UTI   . Gallbladder polyp March 2016    needs f/u scan in Sept 2016  . Type 1 diabetes mellitus (HCC)   .  Hematuria   . Insomnia   . Tobacco abuse   . Gall bladder polyp 3/16    On UA- needs follow up scan 6 months later, (9/16)  . Furuncle of right axilla   . Current use of insulin (HCC)   . ANA positive Oct 2015    1:160 speckled    Patient Active Problem List   Diagnosis Date Noted  . Family history of pheochromocytoma 01/22/2015  . Pulmonary nodules/lesions, multiple 01/11/2015  . Hypoglycemia associated with diabetes (HCC) 08/27/2014  . Acute hemorrhagic enterocolitis (HCC) 08/27/2014  . Leukocytosis 08/27/2014  . Abdominal pain 08/27/2014  . Campylobacter enteritis 08/27/2014  . GI bleed 08/25/2014  . Anxiety 08/25/2014  . HLD (hyperlipidemia) 08/25/2014  . HTN (hypertension) 08/25/2014  . GERD (gastroesophageal reflux disease) 08/25/2014  . Type 1 diabetes (HCC) 08/25/2014  . Gallbladder polyp 07/16/2014  . Tobacco abuse   . Current use of insulin (HCC)   . Insomnia   . CAD (coronary artery disease)   . Gallbladder polyp 04/14/2014  . ANA positive 11/13/2013    Past Surgical History  Procedure Laterality Date  . Cesarean section    . Tonsilectomy/adenoidectomy with myringotomy    . Nasal sinus surgery    . Appendectomy    . Cesarean section      x2    Current Outpatient Rx  Name  Route  Sig  Dispense  Refill  . albuterol (PROVENTIL) (2.5 MG/3ML) 0.083% nebulizer solution   Nebulization  Take 3 mLs (2.5 mg total) by nebulization every 6 (six) hours as needed for wheezing or shortness of breath.   150 mL   1   . amLODipine (NORVASC) 2.5 MG tablet   Oral   Take 1 tablet (2.5 mg total) by mouth daily.   30 tablet   1   . BD PEN NEEDLE NANO U/F 32G X 4 MM MISC      See admin instructions. Use as directed.           Dispense as written.   Marland Kitchen HUMALOG 100 UNIT/ML injection                 Dispense as written.   . hydrOXYzine (ATARAX/VISTARIL) 25 MG tablet      TAKE ONE TABLET AT BEDTIME AS NEEDED   30 tablet   7   . Insulin Human (INSULIN  PUMP) SOLN   Subcutaneous   Inject into the skin continuous. Use as directed continuously.         . metoprolol succinate (TOPROL-XL) 25 MG 24 hr tablet   Oral   Take 1.5 tablets (37.5 mg total) by mouth daily.   45 tablet   6   . omeprazole (PRILOSEC) 20 MG capsule      TAKE ONE CAPSULE BY MOUTH ONCE DAILY   30 capsule   6   . ONE TOUCH ULTRA TEST test strip   Other   1 each by Other route 4 (four) times daily.            Dispense as written.   Marland Kitchen PREVIDENT 5000 BOOSTER PLUS 1.1 % PSTE   Oral   Take 1 application by mouth 2 (two) times daily.            Dispense as written.   . quinapril (ACCUPRIL) 40 MG tablet      TAKE ONE TABLET BY MOUTH DAILY   30 tablet   6   . rosuvastatin (CRESTOR) 10 MG tablet      TAKE ONE TABLET AT BEDTIME FOR CHOLESTEROL   30 tablet   6     Allergies Morphine and related; Sulfa antibiotics; Tramadol; Vicodin; and Septra  Family History  Problem Relation Age of Onset  . Cancer Mother     breast  . Stroke Mother   . Dementia Mother   . Addison's disease Mother   . Diabetes Mother   . Heart disease Mother   . Heart disease Father   . Cancer Sister     breast  . Asthma Daughter   . Allergies Daughter     Social History Social History  Substance Use Topics  . Smoking status: Current Every Day Smoker -- 1.00 packs/day    Types: Cigarettes  . Smokeless tobacco: Never Used  . Alcohol Use: No    Review of Systems  Constitutional: Negative for fever. Eyes: Negative for visual changes. ENT: Negative for sore throat. Cardiovascular: Negative for chest pain. Respiratory: Negative for shortness of breath. Gastrointestinal: Negative for abdominal pain, vomiting and diarrhea. Genitourinary: Negative for dysuria. She's had some vaginal discharge today. No abdominal pain. Musculoskeletal: Negative for back pain. Skin: Negative for rash. Neurological: Negative for headache. 10 point Review of Systems otherwise  negative ____________________________________________   PHYSICAL EXAM:  VITAL SIGNS: ED Triage Vitals  Enc Vitals Group     BP 06/25/15 2144 175/84 mmHg     Pulse Rate 06/25/15 2144 75     Resp 06/25/15 2144 20  Temp 06/25/15 2144 98.6 F (37 C)     Temp Source 06/25/15 2144 Oral     SpO2 06/25/15 2144 99 %     Weight 06/25/15 2144 240 lb (108.863 kg)     Height 06/25/15 2144 5\' 8"  (1.727 m)     Head Cir --      Peak Flow --      Pain Score 06/25/15 2142 3     Pain Loc --      Pain Edu? --      Excl. in GC? --      Constitutional: Alert and oriented. Well appearing and in no distress. HEENT   Head: Normocephalic and atraumatic.      Eyes: Conjunctivae are normal. PERRL. Normal extraocular movements.      Ears:         Nose: No congestion/rhinnorhea.   Mouth/Throat: Mucous membranes are moist.   Neck: No stridor. Cardiovascular/Chest: Normal rate, regular rhythm.  No murmurs, rubs, or gallops. Respiratory: Normal respiratory effort without tachypnea nor retractions. Breath sounds are clear and equal bilaterally. No wheezes/rales/rhonchi. Gastrointestinal: Soft. No distention, no guarding, no rebound. Nontender.    Genitourinary/rectal:Deferred Musculoskeletal: Nontender with normal range of motion in all extremities. No joint effusions.  No lower extremity tenderness.  No edema. Neurologic:  Normal speech and language. No gross or focal neurologic deficits are appreciated.5 out of 5 strength in 4 extremities. Cranial nerves II through X intact. Skin:  Skin is warm, dry and intact. No rash noted. Psychiatric: Mood and affect are normal. Speech and behavior are normal. Patient exhibits appropriate insight and judgment.  ____________________________________________   EKG I, Governor Rooks, MD, the attending physician have personally viewed and interpreted all ECGs.  69 bpm. Normal sinus rhythm. Narrow QRS. Normal axis. Nonspecific  T-wave ____________________________________________  LABS (pertinent positives/negatives)  CBC within normal limits Troponin less than 0.03 Comprehensive metabolic panel without significant abnormality  ____________________________________________  RADIOLOGY All Xrays were viewed by me. Imaging interpreted by Radiologist.  None __________________________________________  PROCEDURES  Procedure(s) performed: None  Critical Care performed: None  ____________________________________________   ED COURSE / ASSESSMENT AND PLAN  Pertinent labs & imaging results that were available during my care of the patient were reviewed by me and considered in my medical decision making (see chart for details).    Blood pressures here not significantly elevated. Certainly the family history of pheochromocytoma, and her intermittent spikes of high blood pressure which are symptomatic raise consideration of this diagnosis. Her primary care physician is evaluating for her for this.  She did not complain of symptoms of stroke or heart attack. It does sound like she had some symptomatic hypertension. Although she does not endorse significant stress, certainly her concern over the potential diagnosis of pheochromocytoma is a little worrisome to her.  We did discuss that she appears to be under great care in terms of treating the hypertension and working towards a diagnosis with her primary care physician.  I think we can go ahead and increase her to 2.5 mg amlodipine starting tomorrow, and I told her it was okay to go ahead and take a full tablet if she has another episode of really high blood pressure like she did this evening.  We discussed returning to the emergency department for any cardiac or neurologic symptoms concerning for ACS or stroke which we went over.    CONSULTATIONS:   None   Patient / Family / Caregiver informed of clinical course, medical decision-making process, and  agree  with plan.   I discussed return precautions, follow-up instructions, and discharged instructions with patient and/or family.   ___________________________________________   FINAL CLINICAL IMPRESSION(S) / ED DIAGNOSES   Final diagnoses:  Essential hypertension              Note: This dictation was prepared with Dragon dictation. Any transcriptional errors that result from this process are unintentional   Governor Rooks, MD 06/26/15 [phone]

## 2015-06-26 NOTE — Discharge Instructions (Signed)
You were evaluated for symptomatic hypertension at home, which was resolved here in the emergency room. You examine evaluation tonight in the emergency department are reassuring.  We discussed you may go ahead and increase your amlodipine from 1.25 mg, half tablet, to a full tablet of 2.5 mg daily starting tomorrow.  If you have an episode of high blood pressure, systolic blood pressure over 200, you may take an additional 1 tablet of 2.5 mg amlodipine.  Return to the emergency department for any worsening condition certainly any weakness or numbness, facial droop, trouble finding words, off balance, weakness, numbness, chest pain, or any other symptoms concerning to you.   Hypertension Hypertension, commonly called high blood pressure, is when the force of blood pumping through your arteries is too strong. Your arteries are the blood vessels that carry blood from your heart throughout your body. A blood pressure reading consists of a higher number over a lower number, such as 110/72. The higher number (systolic) is the pressure inside your arteries when your heart pumps. The lower number (diastolic) is the pressure inside your arteries when your heart relaxes. Ideally you want your blood pressure below 120/80. Hypertension forces your heart to work harder to pump blood. Your arteries may become narrow or stiff. Having untreated or uncontrolled hypertension can cause heart attack, stroke, kidney disease, and other problems. RISK FACTORS Some risk factors for high blood pressure are controllable. Others are not.  Risk factors you cannot control include:   Race. You may be at higher risk if you are African American.  Age. Risk increases with age.  Gender. Men are at higher risk than women before age 48 years. After age 48, women are at higher risk than men. Risk factors you can control include:  Not getting enough exercise or physical activity.  Being overweight.  Getting too much fat,  sugar, calories, or salt in your diet.  Drinking too much alcohol. SIGNS AND SYMPTOMS Hypertension does not usually cause signs or symptoms. Extremely high blood pressure (hypertensive crisis) may cause headache, anxiety, shortness of breath, and nosebleed. DIAGNOSIS To check if you have hypertension, your health care provider will measure your blood pressure while you are seated, with your arm held at the level of your heart. It should be measured at least twice using the same arm. Certain conditions can cause a difference in blood pressure between your right and left arms. A blood pressure reading that is higher than normal on one occasion does not mean that you need treatment. If it is not clear whether you have high blood pressure, you may be asked to return on a different day to have your blood pressure checked again. Or, you may be asked to monitor your blood pressure at home for 1 or more weeks. TREATMENT Treating high blood pressure includes making lifestyle changes and possibly taking medicine. Living a healthy lifestyle can help lower high blood pressure. You may need to change some of your habits. Lifestyle changes may include:  Following the DASH diet. This diet is high in fruits, vegetables, and whole grains. It is low in salt, red meat, and added sugars.  Keep your sodium intake below 2,300 mg per day.  Getting at least 30-45 minutes of aerobic exercise at least 4 times per week.  Losing weight if necessary.  Not smoking.  Limiting alcoholic beverages.  Learning ways to reduce stress. Your health care provider may prescribe medicine if lifestyle changes are not enough to get your blood pressure under  control, and if one of the following is true:  You are 68-61 years of age and your systolic blood pressure is above 140.  You are 50 years of age or older, and your systolic blood pressure is above 150.  Your diastolic blood pressure is above 90.  You have diabetes, and your  systolic blood pressure is over 140 or your diastolic blood pressure is over 90.  You have kidney disease and your blood pressure is above 140/90.  You have heart disease and your blood pressure is above 140/90. Your personal target blood pressure may vary depending on your medical conditions, your age, and other factors. HOME CARE INSTRUCTIONS  Have your blood pressure rechecked as directed by your health care provider.   Take medicines only as directed by your health care provider. Follow the directions carefully. Blood pressure medicines must be taken as prescribed. The medicine does not work as well when you skip doses. Skipping doses also puts you at risk for problems.  Do not smoke.   Monitor your blood pressure at home as directed by your health care provider. SEEK MEDICAL CARE IF:   You think you are having a reaction to medicines taken.  You have recurrent headaches or feel dizzy.  You have swelling in your ankles.  You have trouble with your vision. SEEK IMMEDIATE MEDICAL CARE IF:  You develop a severe headache or confusion.  You have unusual weakness, numbness, or feel faint.  You have severe chest or abdominal pain.  You vomit repeatedly.  You have trouble breathing. MAKE SURE YOU:   Understand these instructions.  Will watch your condition.  Will get help right away if you are not doing well or get worse.   This information is not intended to replace advice given to you by your health care provider. Make sure you discuss any questions you have with your health care provider.   Document Released: 01/30/2005 Document Revised: 06/16/2014 Document Reviewed: 11/22/2012 Elsevier Interactive Patient Education [employer]

## 2015-06-30 ENCOUNTER — Encounter: Payer: Self-pay | Admitting: Family Medicine

## 2015-07-01 ENCOUNTER — Telehealth: Payer: Self-pay | Admitting: Family Medicine

## 2015-07-01 LAB — METANEPHRINES, URINE, 24 HOUR
METANEPH TOTAL UR: 41 ug/L
Metanephrines, 24H Ur: 156 ug/24 hr (ref 45–290)
Normetanephrine, 24H Ur: 296 ug/24 hr (ref 82–500)
Normetanephrine, Ur: 78 ug/L

## 2015-07-01 NOTE — Telephone Encounter (Signed)
Patient notified

## 2015-07-01 NOTE — Telephone Encounter (Signed)
Can you please let Junious DresserConnie know that her metanephrines came back normal again. If she is still really worried about it, she can bring it up to her endocrinologist who can discuss further testing options with her, but at least for right now everything seems to be nice and normal and we just have to get her BP under control. Thanks!

## 2015-07-06 ENCOUNTER — Ambulatory Visit (INDEPENDENT_AMBULATORY_CARE_PROVIDER_SITE_OTHER): Payer: BLUE CROSS/BLUE SHIELD | Admitting: Family Medicine

## 2015-07-06 ENCOUNTER — Encounter: Payer: Self-pay | Admitting: Family Medicine

## 2015-07-06 VITALS — BP 135/85 | HR 77 | Temp 98.3°F | Ht 67.0 in | Wt 239.4 lb

## 2015-07-06 DIAGNOSIS — I10 Essential (primary) hypertension: Secondary | ICD-10-CM

## 2015-07-06 DIAGNOSIS — R233 Spontaneous ecchymoses: Secondary | ICD-10-CM

## 2015-07-06 DIAGNOSIS — Z8489 Family history of other specified conditions: Secondary | ICD-10-CM | POA: Diagnosis not present

## 2015-07-06 DIAGNOSIS — Z1231 Encounter for screening mammogram for malignant neoplasm of breast: Secondary | ICD-10-CM | POA: Diagnosis not present

## 2015-07-06 DIAGNOSIS — R238 Other skin changes: Secondary | ICD-10-CM | POA: Diagnosis not present

## 2015-07-06 DIAGNOSIS — F419 Anxiety disorder, unspecified: Secondary | ICD-10-CM | POA: Diagnosis not present

## 2015-07-06 MED ORDER — ESCITALOPRAM OXALATE 5 MG PO TABS: 5.0000 mg | ORAL_TABLET | Freq: Every day | ORAL | Status: DC

## 2015-07-06 MED ORDER — AMLODIPINE BESYLATE 5 MG PO TABS: ORAL_TABLET | ORAL | Status: DC

## 2015-07-06 NOTE — Assessment & Plan Note (Signed)
Urine metanephrines negative. Still very concerned. Would like to see rheumatology- advised her to check with her endocrinologist.

## 2015-07-06 NOTE — Progress Notes (Signed)
BP 135/85 mmHg  Pulse 77  Temp(Src) 98.3 F (36.8 C)  Ht 5\' 7"  (1.702 m)  Wt 239 lb 6.4 oz (108.591 kg)  BMI 37.49 kg/m2  SpO2 98%  LMP 06/14/2015   Subjective:    Patient ID: Brittany Zavala, female    DOB: [birth year], 50 y.o.   MRN: [id number]  HPI: Brittany Zavala is a 50 y.o. female  Chief Complaint  Patient presents with  . Follow-up   HYPERTENSION Hypertension status: better  Satisfied with current treatment? no Duration of hypertension: chronic BP monitoring frequency:  a few times a day BP medication side effects:  no Medication compliance: excellent compliance Aspirin: yes Recurrent headaches: yes Visual changes: no Palpitations: no Dyspnea: yes Chest pain: no Lower extremity edema: no Dizzy/lightheaded: no  ANXIETY/STRESS Duration:uncontrolled Anxious mood: yes  Excessive worrying: yes Irritability: no  Sweating: no Nausea: no Palpitations:no Hyperventilation: no Panic attacks: no Agoraphobia: no  Obscessions/compulsions: no Depressed mood: no Depression screen PHQ 2/9 03/25/2015  Decreased Interest 2  Down, Depressed, Hopeless 1  PHQ - 2 Score 3  Altered sleeping 2  Tired, decreased energy 2  Change in appetite 1  Feeling bad or failure about yourself  1  Trouble concentrating 2  Moving slowly or fidgety/restless 1  Suicidal thoughts 0  PHQ-9 Score 12  Difficult doing work/chores Somewhat difficult   Anhedonia: no Weight changes: yes Insomnia: no   Hypersomnia: no Fatigue/loss of energy: yes Feelings of worthlessness: no Feelings of guilt: no Impaired concentration/indecisiveness: yes Suicidal ideations: no  Crying spells: no Recent Stressors/Life Changes: no  Relevant past medical, surgical, family and social history reviewed and updated as indicated. Interim medical history since our last visit reviewed. Allergies and medications reviewed and updated.  Review of Systems  Constitutional: Negative.    Respiratory: Negative.   Cardiovascular: Negative.   Psychiatric/Behavioral: Positive for decreased concentration. Negative for suicidal ideas, hallucinations, behavioral problems, confusion, sleep disturbance, self-injury, dysphoric mood and agitation. The patient is nervous/anxious. The patient is not hyperactive.     Per HPI unless specifically indicated above     Objective:    BP 135/85 mmHg  Pulse 77  Temp(Src) 98.3 F (36.8 C)  Ht 5\' 7"  (1.702 m)  Wt 239 lb 6.4 oz (108.591 kg)  BMI 37.49 kg/m2  SpO2 98%  LMP 06/14/2015  Wt Readings from Last 3 Encounters:  07/06/15 239 lb 6.4 oz (108.591 kg)  06/25/15 240 lb (108.863 kg)  06/24/15 240 lb (108.863 kg)    Physical Exam  Constitutional: She is oriented to person, place, and time. She appears well-developed and well-nourished. No distress.  HENT:  Head: Normocephalic and atraumatic.  Right Ear: Hearing normal.  Left Ear: Hearing normal.  Nose: Nose normal.  Eyes: Conjunctivae and lids are normal. Right eye exhibits no discharge. Left eye exhibits no discharge. No scleral icterus.  Pulmonary/Chest: Effort normal. No respiratory distress.  Musculoskeletal: Normal range of motion.  Neurological: She is alert and oriented to person, place, and time.  Skin: Skin is warm, dry and intact. No rash noted. No erythema.  Psychiatric: She has a normal mood and affect. Her speech is normal and behavior is normal. Judgment and thought content normal. Cognition and memory are normal.  Nursing note and vitals reviewed.   Results for orders placed or performed during the hospital encounter of 06/26/15  CBC with Differential  Result Value Ref Range   WBC 9.0 3.6 - 11.0 K/uL   RBC 4.55 3.80 - 5.20  MIL/uL   Hemoglobin 13.3 12.0 - 16.0 g/dL   HCT 16.1 09.6 - 04.5 %   MCV 87.5 80.0 - 100.0 fL   MCH 29.3 26.0 - 34.0 pg   MCHC 33.5 32.0 - 36.0 g/dL   RDW 40.9 (H) 81.1 - 91.4 %   Platelets 216 150 - 440 K/uL   Neutrophils Relative % 47  %   Neutro Abs 4.3 1.4 - 6.5 K/uL   Lymphocytes Relative 42 %   Lymphs Abs 3.8 (H) 1.0 - 3.6 K/uL   Monocytes Relative 8 %   Monocytes Absolute 0.7 0.2 - 0.9 K/uL   Eosinophils Relative 2 %   Eosinophils Absolute 0.2 0 - 0.7 K/uL   Basophils Relative 1 %   Basophils Absolute 0.1 0 - 0.1 K/uL  Troponin I  Result Value Ref Range   Troponin I <0.03 <0.031 ng/mL  Comprehensive metabolic panel  Result Value Ref Range   Sodium 139 135 - 145 mmol/L   Potassium 4.2 3.5 - 5.1 mmol/L   Chloride 106 101 - 111 mmol/L   CO2 27 22 - 32 mmol/L   Glucose, Bld 90 65 - 99 mg/dL   BUN 7 6 - 20 mg/dL   Creatinine, Ser 7.82 0.44 - 1.00 mg/dL   Calcium 9.2 8.9 - 95.6 mg/dL   Total Protein 7.4 6.5 - 8.1 g/dL   Albumin 4.0 3.5 - 5.0 g/dL   AST 24 15 - 41 U/L   ALT 18 14 - 54 U/L   Alkaline Phosphatase 76 38 - 126 U/L   Total Bilirubin 0.1 (L) 0.3 - 1.2 mg/dL   GFR calc non Af Amer >60 >60 mL/min   GFR calc Af Amer >60 >60 mL/min   Anion gap 6 5 - 15      Assessment & Plan:   Problem List Items Addressed This Visit      Cardiovascular and Mediastinum   HTN (hypertension) - Primary    Under better control today. Having issues at night. Will have her take extra 1/2 of her amlodipine in the evening. Call with concerns. Her husband would like her to see cardiology. Referral generated today. Await results.  Recheck 1 month.       Relevant Medications   amLODipine (NORVASC) 5 MG tablet   Other Relevant Orders   Ambulatory referral to Cardiology     Other   Anxiety    Will try her on lexapro, gained a lot of weight on prozac and had bad reaction to wellbutrin. Recheck 1 month. Call with concerns.       Relevant Medications   escitalopram (LEXAPRO) 5 MG tablet   Family history of pheochromocytoma    Urine metanephrines negative. Still very concerned. Would like to see rheumatology- advised her to check with her endocrinologist.       Relevant Orders   Ambulatory referral to Rheumatology     Other Visit Diagnoses    Easy bruising        Normal labs. Would like to be checked for EDS- would like to see rheum. Referral generated today.    Relevant Orders    Ambulatory referral to Rheumatology    Visit for screening mammogram        Mammogram ordered today. Await results.    Relevant Orders    MM DIGITAL SCREENING BILATERAL        Follow up plan: Return in about 4 weeks (around 08/03/2015) for recheck BP and mood.

## 2015-07-06 NOTE — Assessment & Plan Note (Signed)
Will try her on lexapro, gained a lot of weight on prozac and had bad reaction to wellbutrin. Recheck 1 month. Call with concerns.

## 2015-07-06 NOTE — Assessment & Plan Note (Addendum)
Under better control today. Having issues at night. Will have her take extra 1/2 of her amlodipine in the evening. Call with concerns. Her husband would like her to see cardiology. Referral generated today. Await results.  Recheck 1 month.

## 2015-07-15 ENCOUNTER — Telehealth: Payer: Self-pay

## 2015-07-15 NOTE — Telephone Encounter (Signed)
Have you contacted the patient?

## 2015-07-15 NOTE — Telephone Encounter (Signed)
[facility] [place] [facility] at 2:00PM.  Referral was initially supposed to be for rheumatology, however after [facility] review the notes/referral, they called and said patient needs to see Hematology.  This is the soonest patient could get in because of a cancellation. She is on the wait list. If she needs to reschedule this appointment, it won't be until October through January of next year.   [address] [facility] [address], [place], [place] [address]  [phone]

## 2015-07-15 NOTE — Telephone Encounter (Signed)
I have a message for the patient to return my phone call.

## 2015-07-21 ENCOUNTER — Other Ambulatory Visit: Payer: Self-pay | Admitting: Family Medicine

## 2015-07-27 ENCOUNTER — Ambulatory Visit (INDEPENDENT_AMBULATORY_CARE_PROVIDER_SITE_OTHER): Payer: BLUE CROSS/BLUE SHIELD | Admitting: Family Medicine

## 2015-07-27 ENCOUNTER — Encounter: Payer: Self-pay | Admitting: Family Medicine

## 2015-07-27 VITALS — BP 129/83 | HR 77 | Temp 99.3°F | Ht 67.0 in | Wt 240.0 lb

## 2015-07-27 DIAGNOSIS — F419 Anxiety disorder, unspecified: Secondary | ICD-10-CM | POA: Diagnosis not present

## 2015-07-27 DIAGNOSIS — I10 Essential (primary) hypertension: Secondary | ICD-10-CM

## 2015-07-27 MED ORDER — AMLODIPINE BESYLATE 5 MG PO TABS
ORAL_TABLET | ORAL | Status: DC
Start: 2015-07-27 — End: 2015-09-14

## 2015-07-27 MED ORDER — METOPROLOL SUCCINATE ER 25 MG PO TB24
37.5000 mg | ORAL_TABLET | Freq: Every day | ORAL | Status: DC
Start: 2015-07-27 — End: 2015-12-21

## 2015-07-27 MED ORDER — QUINAPRIL HCL 40 MG PO TABS
40.0000 mg | ORAL_TABLET | Freq: Every day | ORAL | Status: DC
Start: 2015-07-27 — End: 2015-12-21

## 2015-07-27 NOTE — Progress Notes (Signed)
BP 129/83 mmHg  Pulse 77  Temp(Src) 99.3 F (37.4 C)  Ht 5\' 7"  (1.702 m)  Wt 240 lb (108.863 kg)  BMI 37.58 kg/m2  SpO2 98%   Subjective:    Patient ID: Brittany Zavala, female    DOB: Jul 27, 1965, 50 y.o.   MRN: [id number]  HPI: Brittany Zavala is a 50 y.o. female  Chief Complaint  Patient presents with  . Hypertension  . Anxiety   HYPERTENSION- still going high in the PM. Has been under a lot of stress. Hypertension status: controlled  Satisfied with current treatment? yes Duration of hypertension: chronic BP monitoring frequency:  a few times a week BP range:  BP medication side effects:  no Medication compliance: excellent compliance Aspirin: yes Recurrent headaches: no Visual changes: no Palpitations: no Dyspnea: no Chest pain: no Lower extremity edema: no Dizzy/lightheaded: no  ANXIETY/STRESS- hasn't started the lexapro yet. Very anxious about her son. Also very anxious that it is going to make her gain weight  Duration:exacerbated Anxious mood: yes  Excessive worrying: yes Irritability: yes  Sweating: yes Nausea: yes Palpitations:yes Hyperventilation: yes Panic attacks: yes Agoraphobia: no  Obscessions/compulsions: no Depressed mood: yes GAD 7 : Generalized Anxiety Score 07/27/2015  Nervous, Anxious, on Edge 3  Control/stop worrying 3  Worry too much - different things 3  Trouble relaxing 3  Restless 1  Easily annoyed or irritable 3  Afraid - awful might happen 3  Total GAD 7 Score 19  Anxiety Difficulty Somewhat difficult   Anhedonia: no Weight changes: yes Insomnia: yes hard to fall asleep  Hypersomnia: no Fatigue/loss of energy: yes Feelings of worthlessness: yes Feelings of guilt: yes Impaired concentration/indecisiveness: yes Suicidal ideations: no  Crying spells: yes Recent Stressors/Life Changes: yes  Relevant past medical, surgical, family and social history reviewed and updated as indicated. Interim medical  history since our last visit reviewed. Allergies and medications reviewed and updated.  Review of Systems  Constitutional: Negative.   Respiratory: Negative.   Cardiovascular: Negative.   Psychiatric/Behavioral: Positive for sleep disturbance, dysphoric mood and agitation. Negative for suicidal ideas, hallucinations, behavioral problems, confusion, self-injury and decreased concentration. The patient is nervous/anxious. The patient is not hyperactive.     Per HPI unless specifically indicated above     Objective:    BP 129/83 mmHg  Pulse 77  Temp(Src) 99.3 F (37.4 C)  Ht 5\' 7"  (1.702 m)  Wt 240 lb (108.863 kg)  BMI 37.58 kg/m2  SpO2 98%  Wt Readings from Last 3 Encounters:  07/27/15 240 lb (108.863 kg)  07/06/15 239 lb 6.4 oz (108.591 kg)  06/25/15 240 lb (108.863 kg)    Physical Exam  Constitutional: She is oriented to person, place, and time. She appears well-developed and well-nourished. No distress.  HENT:  Head: Normocephalic and atraumatic.  Right Ear: Hearing normal.  Left Ear: Hearing normal.  Nose: Nose normal.  Eyes: Conjunctivae and lids are normal. Right eye exhibits no discharge. Left eye exhibits no discharge. No scleral icterus.  Cardiovascular: Normal rate, regular rhythm, normal heart sounds and intact distal pulses.  Exam reveals no gallop and no friction rub.   No murmur heard. Pulmonary/Chest: Effort normal and breath sounds normal. No respiratory distress. She has no wheezes. She has no rales. She exhibits no tenderness.  Musculoskeletal: Normal range of motion.  Neurological: She is alert and oriented to person, place, and time.  Skin: Skin is warm, dry and intact. No rash noted. She is not diaphoretic. No erythema.  No pallor.  Psychiatric: She has a normal mood and affect. Her speech is normal and behavior is normal. Judgment and thought content normal. Cognition and memory are normal.  Nursing note and vitals reviewed.      Assessment & Plan:    Problem List Items Addressed This Visit      Cardiovascular and Mediastinum   HTN (hypertension) - Primary    Under good control, but going up at night. Will increase her amlodipine to BID (total  daily) and recheck in 1 month.       Relevant Medications   metoprolol succinate (TOPROL-XL) 25 MG 24 hr tablet   quinapril (ACCUPRIL) 40 MG tablet   amLODipine (NORVASC) 5 MG tablet     Other   Anxiety    Has not started her lexapro. Will start it. Follow up in 1 month.           Follow up plan: Return in about 4 weeks (around 08/24/2015).

## 2015-07-27 NOTE — Assessment & Plan Note (Signed)
Has not started her lexapro. Will start it. Follow up in 1 month.

## 2015-07-27 NOTE — Assessment & Plan Note (Signed)
Under good control, but going up at night. Will increase her amlodipine to BID (total 10mg  daily) and recheck in 1 month.

## 2015-08-04 ENCOUNTER — Ambulatory Visit (INDEPENDENT_AMBULATORY_CARE_PROVIDER_SITE_OTHER): Payer: BLUE CROSS/BLUE SHIELD | Admitting: Cardiology

## 2015-08-04 ENCOUNTER — Encounter: Payer: Self-pay | Admitting: Cardiology

## 2015-08-04 VITALS — BP 124/72 | HR 78 | Ht 68.0 in | Wt 239.5 lb

## 2015-08-04 DIAGNOSIS — I25119 Atherosclerotic heart disease of native coronary artery with unspecified angina pectoris: Secondary | ICD-10-CM

## 2015-08-04 DIAGNOSIS — R079 Chest pain, unspecified: Secondary | ICD-10-CM | POA: Diagnosis not present

## 2015-08-04 DIAGNOSIS — I10 Essential (primary) hypertension: Secondary | ICD-10-CM | POA: Diagnosis not present

## 2015-08-04 DIAGNOSIS — R0602 Shortness of breath: Secondary | ICD-10-CM

## 2015-08-04 DIAGNOSIS — E785 Hyperlipidemia, unspecified: Secondary | ICD-10-CM

## 2015-08-04 MED ORDER — ASPIRIN EC 81 MG PO TBEC: 81.0000 mg | DELAYED_RELEASE_TABLET | Freq: Every day | ORAL | Status: DC

## 2015-08-04 NOTE — Progress Notes (Signed)
Cardiology Office Note   Date:  08/04/2015   ID:  Brittany Zavala, DOB [birth year], MRN [id number]  Referring Doctor:  Olevia PerchesMegan Johnson, DO   Cardiologist:   Almond LintAileen Camesha Farooq, MD   Reason for consultation:  Chief Complaint  Patient presents with  . other    Ref by Dr. Laural BenesJohnson for HTN. Meds reviewed by the patient verbally. Pt. c/o chest pain, shortness of breath and HTN.       History of Present Illness: Brittany Zavala is a 50 y.o. female who presents for Evaluation for chest pain.  Recently, patient has been dealing with elevated blood pressure measurements. She is undergoing a lot of stress at home and she feels that this has a lot to do with her hypertension. Her PCP has titrated her doses. Her blood pressure did improve, but she finds the need to take an extra dose when she knows her blood pressures up. This usually happens when she's under stress.  Patient also has been having episodes of chest discomfort. She describes it as a sudden onset pounding or punching sensation under her chest. This comes on without any warning, maybe mild 3 out of 10 but it can be really bad occasionally. It lasts a second or so. Randomly occurring sometimes exertional. Nonradiating. This has been going on for the last 2 years. Onset was after she had a cardiac evaluation in 2013 oh 2014.  She had previously seen Dr. Gwen PoundsKowalski for issues with chest pain and family history of CAD. She underwent the exercise stress test. It was found to be abnormal and she was then set up for heart cath. She remembers being told that she has mild blockages that did not need any stenting. She was then started on medications. She has not been taking aspirin daily.  Patient denies fever, cough, colds, abdominal pain. No PND, orthopnea, edema.   ROS:  Please see the history of present illness. Aside from mentioned under HPI, all other systems are reviewed and negative.     Past Medical History  Diagnosis  Date  . Hyperlipidemia   . Hypertension   . Depression   . Anxiety   . Insomnia   . Chronic UTI   . Gallbladder polyp March 2016    needs f/u scan in Sept 2016  . Type 1 diabetes mellitus (HCC)   . Hematuria   . Insomnia   . Tobacco abuse   . Gall bladder polyp 3/16    On UA- needs follow up scan 6 months later, (9/16)  . Furuncle of right axilla   . Current use of insulin (HCC)   . ANA positive Oct 2015    1:160 speckled  . CAD (coronary artery disease)     Past Surgical History  Procedure Laterality Date  . Cesarean section    . Tonsilectomy/adenoidectomy with myringotomy    . Nasal sinus surgery    . Appendectomy    . Cesarean section      x2  . Cardiac catheterization       reports that she has been smoking Cigarettes.  She has been smoking about 1.00 pack per day. She has never used smokeless tobacco. She reports that she does not drink alcohol or use illicit drugs.   family history includes Addison's disease in her mother; Allergies in her daughter; Asthma in her daughter; Cancer in her mother and sister; Dementia in her mother; Diabetes in her mother; Heart disease in her father and mother; Stroke in  her mother.   Current Outpatient Prescriptions  Medication Sig Dispense Refill  . albuterol (PROVENTIL) (2.5 MG/3ML) 0.083% nebulizer solution Take 3 mLs (2.5 mg total) by nebulization every 6 (six) hours as needed for wheezing or shortness of breath. 150 mL 1  . amLODipine (NORVASC) 5 MG tablet 1 tab BID 60 tablet 6  . BD PEN NEEDLE NANO U/F 32G X 4 MM MISC See admin instructions. Use as directed.    . escitalopram (LEXAPRO) 5 MG tablet Take 1 tablet (5 mg total) by mouth daily. 30 tablet 1  . HUMALOG 100 UNIT/ML injection     . hydrOXYzine (ATARAX/VISTARIL) 25 MG tablet TAKE ONE TABLET AT BEDTIME AS NEEDED 30 tablet 7  . Insulin Human (INSULIN PUMP) SOLN Inject into the skin continuous. Use as directed continuously.    . metoprolol succinate (TOPROL-XL) 25 MG 24 hr  tablet Take 1.5 tablets (37.5 mg total) by mouth daily. 45 tablet 6  . omeprazole (PRILOSEC) 20 MG capsule TAKE ONE CAPSULE BY MOUTH ONCE DAILY 30 capsule 6  . ONE TOUCH ULTRA TEST test strip 1 each by Other route 4 (four) times daily.     Marland Kitchen PREVIDENT 5000 BOOSTER PLUS 1.1 % PSTE Take 1 application by mouth 2 (two) times daily.     . quinapril (ACCUPRIL) 40 MG tablet Take 1 tablet (40 mg total) by mouth daily. 30 tablet 6  . rosuvastatin (CRESTOR) 10 MG tablet TAKE ONE TABLET AT BEDTIME FOR CHOLESTEROL 30 tablet 6  . aspirin EC 81 MG tablet Take 1 tablet (81 mg total) by mouth daily. 90 tablet 3   No current facility-administered medications for this visit.    Allergies: Morphine and related; Sulfa antibiotics; Tramadol; Vicodin; and Septra    PHYSICAL EXAM: VS:  BP 124/72 mmHg  Pulse 78  Ht  (1.727 m)  Wt 239 lb 8 oz (108.636 kg)  BMI 36.42 kg/m2 , Body mass index is 36.42 kg/(m^2). Wt Readings from Last 3 Encounters:  08/04/15 239 lb 8 oz (108.636 kg)  07/27/15 240 lb (108.863 kg)  07/06/15 239 lb 6.4 oz (108.591 kg)    GENERAL:  well developed, well nourished, obese, not in acute distress HEENT: normocephalic, pink conjunctivae, anicteric sclerae, no xanthelasma, normal dentition, oropharynx clear NECK:  no neck vein engorgement, JVP normal, no hepatojugular reflux, carotid upstroke brisk and symmetric, no bruit, no thyromegaly, no lymphadenopathy LUNGS:  good respiratory effort, clear to auscultation bilaterally CV:  PMI not displaced, no thrills, no lifts, S1 and S2 within normal limits, no palpable S3 or S4, no murmurs, no rubs, no gallops ABD:  Soft, nontender, nondistended, normoactive bowel sounds, no abdominal aortic bruit, no hepatomegaly, no splenomegaly MS: nontender back, no kyphosis, no scoliosis, no joint deformities EXT:  2+ DP/PT pulses, no edema, no varicosities, no cyanosis, no clubbing SKIN: warm, nondiaphoretic, normal turgor, no ulcers NEUROPSYCH: alert,  oriented to person, place, and time, sensory/motor grossly intact, normal mood, appropriate affect  Recent Labs: 03/25/2015: TSH 0.839 06/25/2015: ALT 18; BUN 7; Creatinine, Ser 0.75; Hemoglobin 13.3; Platelets 216; Potassium 4.2; Sodium 139   Lipid Panel    Component Value Date/Time   CHOL 147 03/25/2015 1404   TRIG 119 03/25/2015 1404   HDL 48 03/25/2015 1404   LDLCALC 75 03/25/2015 1404     Other studies Reviewed:  EKG:  The ekg from 08/04/2015 was personally reviewed by me and it revealed sinus rhythm, 73 bpm nonspecific ST-T wave changes.  Additional studies/ records that  were reviewed personally reviewed by me today include: None available   ASSESSMENT AND PLAN:  Chest pain CAD Atypical features. However, she is known to have CAD based on cardiac cath 2013 oh 2014. Records unavailable for review at the moment. She was started on medications for this. Recommend aspirin 81 mg by mouth daily. Continue beta blocker, ACE inhibitor, statin therapy. Recommend further evaluation with pharmacologic nuclear stress test. Patient unable to walk on treadmill due to recent ankle fracture on the right leg. Recommend echocardiogram.  Hypertension Currently blood pressure within normal limits. PCP at adjusting her medication doses.  Hyperlipidemia LDL goal is less than 70 due to diabetes and known CAD.  Tobacco use We discussed the importance of smoking cessation and different strategies for quitting.   Obesity Body mass index is 36.42 kg/(m^2).Marland Kitchen Recommend aggressive weight loss through diet and increased physical activity. This is once cardiac workup is done.   Current medicines are reviewed at length with the patient today.  The patient does not have concerns regarding medicines.  Labs/ tests ordered today include:  Orders Placed This Encounter  Procedures  . NM Myocar Multi W/Spect W/Wall Motion / EF  . EKG 12-Lead  . ECHOCARDIOGRAM COMPLETE    I had a lengthy and  detailed discussion with the patient regarding diagnoses, prognosis, diagnostic options, treatment options , and side effects of medications.   I counseled the patient on importance of lifestyle modification including heart healthy diet, regular physical activity , and smoking cessation.   Disposition:   FU with undersigned after tests   Signed, Almond Lint, MD  08/04/2015 10:58 AM    [facility]

## 2015-08-04 NOTE — Patient Instructions (Addendum)
Medication Instructions:  Your physician has recommended you make the following change in your medication:  1. Start Aspirin 81 mg Once daily   Labwork: None ordered  Testing/Procedures: Your physician has requested that you have an echocardiogram. Echocardiography is a painless test that uses sound waves to create images of your heart. It provides your doctor with information about the size and shape of your heart and how well your heart's chambers and valves are working. This procedure takes approximately one hour. There are no restrictions for this procedure.  Date & Time: ___________________________________________________________  [facility]  Your caregiver has ordered a Stress Test with nuclear imaging. The purpose of this test is to evaluate the blood supply to your heart muscle. This procedure is referred to as a "Non-Invasive Stress Test." This is because other than having an IV started in your vein, nothing is inserted or "invades" your body. Cardiac stress tests are done to find areas of poor blood flow to the heart by determining the extent of coronary artery disease (CAD). Some patients exercise on a treadmill, which naturally increases the blood flow to your heart, while others who are  unable to walk on a treadmill due to physical limitations have a pharmacologic/chemical stress agent called Lexiscan . This medicine will mimic walking on a treadmill by temporarily increasing your coronary blood flow.   Please note: these test may take anywhere between 2-4 hours to complete  PLEASE REPORT TO [facility]TR[facility]  THE VOLUNTEERS AT THE FIRST DESK WILL DIRECT YOU WHERE TO GO  Date of Procedure:_Thursday August 12, 2015 at 08:00AM_  Arrival Time for Procedure:__Arrive at 07:45AM to register______  Instructions regarding medication:   _X___ : Call your doctor about insulin pump and nothing to eat after midnight prior to testing. Normally we have people hold their diabetes  medication the morning of procedure, but see what their recommendations are with your insulin pump  __X__:  Hold metoprolol the night before procedure and morning of procedure    PLEASE NOTIFY THE OFFICE AT LEAST 24 HOURS IN ADVANCE IF YOU ARE UNABLE TO KEEP YOUR APPOINTMENT.  [phone] AND  PLEASE NOTIFY [facility]IC[facility] AT [facility] AT LEAST 24 HOURS IN ADVANCE IF YOU ARE UNABLE TO KEEP YOUR APPOINTMENT. [phone]  How to prepare for your Myoview test:   Do not eat or drink after midnight  No caffeine for 24 hours prior to test  No smoking 24 hours prior to test.  Your medication may be taken with water.  If your doctor stopped a medication because of this test, do not take that medication.  Ladies, please do not wear dresses.  Skirts or pants are appropriate. Please wear a short sleeve shirt.  No perfume, cologne or lotion.  Wear comfortable walking shoes. No heels!   Follow-Up: Your physician recommends that you schedule a follow-up appointment after testing to review results.  Date & Time: _____________________________________________________  It was a pleasure seeing you today here in the office. Please do not hesitate to give us a call back if you have any further questions. [phone]  [place] Rock CellarPamela A. RN, BSN      Any Other Special Instructions Will Be Listed Below (If Applicable).     If you need a refill on your cardiac medications before your next appointment, please call your pharmacy.  Echocardiogram An echocardiogram, or echocardiography, uses sound waves (ultrasound) to produce an image of your heart. The echocardiogram is simple, painless, obtained within a short period of time,  and offers valuable information to your health care provider. The images from an echocardiogram can provide information such as:  Evidence of coronary artery disease (CAD).  Heart size.  Heart muscle function.  Heart valve function.  Aneurysm detection.  Evidence of a  past heart attack.  Fluid buildup around the heart.  Heart muscle thickening.  Assess heart valve function. LET [facility] CARE PROVIDER KNOW ABOUT:  Any allergies you have.  All medicines you are taking, including vitamins, herbs, eye drops, creams, and over-the-counter medicines.  Previous problems you or members of your family have had with the use of anesthetics.  Any blood disorders you have.  Previous surgeries you have had.  Medical conditions you have.  Possibility of pregnancy, if this applies. BEFORE THE PROCEDURE  No special preparation is needed. Eat and drink normally.  PROCEDURE   In order to produce an image of your heart, gel will be applied to your chest and a wand-like tool (transducer) will be moved over your chest. The gel will help transmit the sound waves from the transducer. The sound waves will harmlessly bounce off your heart to allow the heart images to be captured in real-time motion. These images will then be recorded.  You may need an IV to receive a medicine that improves the quality of the pictures. AFTER THE PROCEDURE You may return to your normal schedule including diet, activities, and medicines, unless your health care provider tells you otherwise.   This information is not intended to replace advice given to you by your health care provider. Make sure you discuss any questions you have with your health care provider.   Document Released: 01/28/2000 Document Revised: 02/20/2014 Document Reviewed: 10/07/2012 Elsevier Interactive Patient Education 2016 [employer]     Pharmacologic Stress Electrocardiogram A pharmacologic stress electrocardiogram is a heart (cardiac) test that uses nuclear imaging to evaluate the blood supply to your heart. This test may also be called a pharmacologic stress electrocardiography. Pharmacologic means that a medicine is used to increase your heart rate and blood pressure.  This stress test is done to find  areas of poor blood flow to the heart by determining the extent of coronary artery disease (CAD). Some people exercise on a treadmill, which naturally increases the blood flow to the heart. For those people unable to exercise on a treadmill, a medicine is used. This medicine stimulates your heart and will cause your heart to beat harder and more quickly, as if you were exercising.  Pharmacologic stress tests can help determine:  The adequacy of blood flow to your heart during increased levels of activity in order to clear you for discharge home.  The extent of coronary artery blockage caused by CAD.  Your prognosis if you have suffered a heart attack.  The effectiveness of cardiac procedures done, such as an angioplasty, which can increase the circulation in your coronary arteries.  Causes of chest pain or pressure. LET [facility] CARE PROVIDER KNOW ABOUT:  Any allergies you have.  All medicines you are taking, including vitamins, herbs, eye drops, creams, and over-the-counter medicines.  Previous problems you or members of your family have had with the use of anesthetics.  Any blood disorders you have.  Previous surgeries you have had.  Medical conditions you have.  Possibility of pregnancy, if this applies.  If you are currently breastfeeding. RISKS AND COMPLICATIONS Generally, this is a safe procedure. However, as with any procedure, complications can occur. Possible complications include:  You develop  pain or pressure in the following areas:  Chest.  Jaw or neck.  Between your shoulder blades.  Radiating down your left arm.  Headache.  Dizziness or light-headedness.  Shortness of breath.  Increased or irregular heartbeat.  Low blood pressure.  Nausea or vomiting.  Flushing.  Redness going up the arm and slight pain during injection of medicine.  Heart attack (rare). BEFORE THE PROCEDURE   Avoid all forms of caffeine for 24 hours before your test or as  directed by your health care provider. This includes coffee, tea (even decaffeinated tea), caffeinated sodas, chocolate, cocoa, and certain pain medicines.  Follow your health care provider's instructions regarding eating and drinking before the test.  Take your medicines as directed at regular times with water unless instructed otherwise. Exceptions may include:  If you have diabetes, ask how you are to take your insulin or pills. It is common to adjust insulin dosing the morning of the test.  If you are taking beta-blocker medicines, it is important to talk to your health care provider about these medicines well before the date of your test. Taking beta-blocker medicines may interfere with the test. In some cases, these medicines need to be changed or stopped 24 hours or more before the test.  If you wear a nitroglycerin patch, it may need to be removed prior to the test. Ask your health care provider if the patch should be removed before the test.  If you use an inhaler for any breathing condition, bring it with you to the test.  If you are an outpatient, bring a snack so you can eat right after the stress phase of the test.  Do not smoke for 4 hours prior to the test or as directed by your health care provider.  Do not apply lotions, powders, creams, or oils on your chest prior to the test.  Wear comfortable shoes and clothing. Let your health care provider know if you were unable to complete or follow the preparations for your test. PROCEDURE   Multiple patches (electrodes) will be put on your chest. If needed, small areas of your chest may be shaved to get better contact with the electrodes. Once the electrodes are attached to your body, multiple wires will be attached to the electrodes, and your heart rate will be monitored.  An IV access will be started. A nuclear trace (isotope) is given. The isotope may be given intravenously, or it may be swallowed. Nuclear refers to several  types of radioactive isotopes, and the nuclear isotope lights up the arteries so that the nuclear images are clear. The isotope is absorbed by your body. This results in low radiation exposure.  A resting nuclear image is taken to show how your heart functions at rest.  A medicine is given through the IV access.  A second scan is done about 1 hour after the medicine injection and determines how your heart functions under stress.  During this stress phase, you will be connected to an electrocardiogram machine. Your blood pressure and oxygen levels will be monitored. AFTER THE PROCEDURE   Your heart rate and blood pressure will be monitored after the test.  You may return to your normal schedule, including diet,activities, and medicines, unless your health care provider tells you otherwise.   This information is not intended to replace advice given to you by your health care provider. Make sure you discuss any questions you have with your health care provider.   Document Released: 06/18/2008 Document  Revised: 02/04/2013 Document Reviewed: 10/07/2012 Elsevier Interactive Patient Education [employer]

## 2015-08-06 ENCOUNTER — Ambulatory Visit (INDEPENDENT_AMBULATORY_CARE_PROVIDER_SITE_OTHER): Payer: BLUE CROSS/BLUE SHIELD | Admitting: Unknown Physician Specialty

## 2015-08-06 ENCOUNTER — Encounter: Payer: Self-pay | Admitting: Unknown Physician Specialty

## 2015-08-06 VITALS — BP 135/85 | HR 89 | Temp 98.6°F | Ht 68.0 in | Wt 241.0 lb

## 2015-08-06 DIAGNOSIS — R109 Unspecified abdominal pain: Secondary | ICD-10-CM | POA: Diagnosis not present

## 2015-08-06 NOTE — Progress Notes (Signed)
BP 135/85 mmHg  Pulse 89  Temp(Src) 98.6 F (37 C)  Ht 5\' 8"  (1.727 m)  Wt 241 lb (109.317 kg)  BMI 36.65 kg/m2  SpO2 98%   Subjective:    Patient ID: Brittany Zavala, female    DOB: 1966/02/13, 50 y.o.   MRN: [id number]  HPI: Brittany Zavala is a 50 y.o. female  Chief Complaint  Patient presents with  . Abdominal Pain    pt states she has been having pain in lower right abdomen since Tuesday    This is a patient who's case is complicated by Type 1 DM which is difficult to control  Abdominal Pain This is a new problem. Episode onset: 2 days. The onset quality is sudden. The problem occurs constantly. The most recent episode lasted 2 days. The problem has been gradually worsening. The pain is located in the RLQ. The pain is at a severity of 6/10. The quality of the pain is sharp. The abdominal pain does not radiate. Associated symptoms include a fever and nausea. Pertinent negatives include no constipation, diarrhea, dysuria or vomiting. Exacerbated by: Movement or coughing. The pain is relieved by being still. She has tried nothing for the symptoms. Type 1 DM   Of note, she is having her menstural period which started yesterday She is s/p appendicitis  Relevant past medical, surgical, family and social history reviewed and updated as indicated. Interim medical history since our last visit reviewed. Allergies and medications reviewed and updated.  Review of Systems  Constitutional: Positive for fever.  Gastrointestinal: Positive for nausea and abdominal pain. Negative for vomiting, diarrhea and constipation.  Genitourinary: Negative for dysuria.    Per HPI unless specifically indicated above     Objective:    BP 135/85 mmHg  Pulse 89  Temp(Src) 98.6 F (37 C)  Ht 5\' 8"  (1.727 m)  Wt 241 lb (109.317 kg)  BMI 36.65 kg/m2  SpO2 98%  Wt Readings from Last 3 Encounters:  08/06/15 241 lb (109.317 kg)  08/04/15 239 lb 8 oz (108.636 kg)  07/27/15  240 lb (108.863 kg)    Physical Exam  Constitutional: She is oriented to person, place, and time. She appears well-developed and well-nourished. No distress.  HENT:  Head: Normocephalic and atraumatic.  Eyes: Conjunctivae and lids are normal. Right eye exhibits no discharge. Left eye exhibits no discharge. No scleral icterus.  Neck: Normal range of motion. Neck supple. No JVD present. Carotid bruit is not present.  Cardiovascular: Normal rate, regular rhythm and normal heart sounds.   Pulmonary/Chest: Effort normal and breath sounds normal.  Abdominal: Normal appearance. There is no splenomegaly or hepatomegaly. There is tenderness in the right upper quadrant and right lower quadrant. There is guarding. There is no rebound and no CVA tenderness.  Musculoskeletal: Normal range of motion.  Neurological: She is alert and oriented to person, place, and time.  Skin: Skin is warm, dry and intact. No rash noted. No pallor.  Psychiatric: She has a normal mood and affect. Her behavior is normal. Judgment and thought content normal.   WBCs are 14.5 with shift  Results for orders placed or performed during the hospital encounter of 06/26/15  CBC with Differential  Result Value Ref Range   WBC 9.0 3.6 - 11.0 K/uL   RBC 4.55 3.80 - 5.20 MIL/uL   Hemoglobin 13.3 12.0 - 16.0 g/dL   HCT 40.939.8 81.135.0 - 91.447.0 %   MCV 87.5 80.0 - 100.0 fL   MCH 29.3  26.0 - 34.0 pg   MCHC 33.5 32.0 - 36.0 g/dL   RDW 16.114.6 (H) 09.611.5 - 04.514.5 %   Platelets 216 150 - 440 K/uL   Neutrophils Relative % 47 %   Neutro Abs 4.3 1.4 - 6.5 K/uL   Lymphocytes Relative 42 %   Lymphs Abs 3.8 (H) 1.0 - 3.6 K/uL   Monocytes Relative 8 %   Monocytes Absolute 0.7 0.2 - 0.9 K/uL   Eosinophils Relative 2 %   Eosinophils Absolute 0.2 0 - 0.7 K/uL   Basophils Relative 1 %   Basophils Absolute 0.1 0 - 0.1 K/uL  Troponin I  Result Value Ref Range   Troponin I <0.03 <0.031 ng/mL  Comprehensive metabolic panel  Result Value Ref Range    Sodium 139 135 - 145 mmol/L   Potassium 4.2 3.5 - 5.1 mmol/L   Chloride 106 101 - 111 mmol/L   CO2 27 22 - 32 mmol/L   Glucose, Bld 90 65 - 99 mg/dL   BUN 7 6 - 20 mg/dL   Creatinine, Ser 4.090.75 0.44 - 1.00 mg/dL   Calcium 9.2 8.9 - 81.110.3 mg/dL   Total Protein 7.4 6.5 - 8.1 g/dL   Albumin 4.0 3.5 - 5.0 g/dL   AST 24 15 - 41 U/L   ALT 18 14 - 54 U/L   Alkaline Phosphatase 76 38 - 126 U/L   Total Bilirubin 0.1 (L) 0.3 - 1.2 mg/dL   GFR calc non Af Amer >60 >60 mL/min   GFR calc Af Amer >60 >60 mL/min   Anion gap 6 5 - 15      Assessment & Plan:   Problem List Items Addressed This Visit      Unprioritized   Abdominal pain - Primary   Relevant Orders   UA/M w/rflx Culture, Routine   CBC With Differential/Platelet      Discussed with Dr. Laural BenesJohnson.  Refer to the ER for abdominal pain and elevated WBC [facility] in the [facility]   Follow up plan: Return for in the ER.

## 2015-08-09 ENCOUNTER — Telehealth: Payer: Self-pay

## 2015-08-09 DIAGNOSIS — R300 Dysuria: Secondary | ICD-10-CM

## 2015-08-09 MED ORDER — CIPROFLOXACIN HCL 500 MG PO TABS: 500.0000 mg | ORAL_TABLET | Freq: Two times a day (BID) | ORAL | Status: AC

## 2015-08-09 NOTE — Telephone Encounter (Signed)
Patient notified

## 2015-08-09 NOTE — Telephone Encounter (Signed)
Rx for cipro sent to her pharmacy. If not better by end of course, drop off urine so we can get a culture.

## 2015-08-09 NOTE — Telephone Encounter (Signed)
Pt states that she was seen at the ER last week as instructed by Dr. Laural BenesJohnson. The ER doctor told her that she has "focus on fat" causing her abdominal pain. Pt states that the ER doctor performed a pelvic exam and that she felt like she had a UTI afterwards. Pt also states that she had two Cipro at home and that she took those and it seemed to help alleviate some of her pain. Pt would like an RX [facility] [phone] for her UTI symptoms. Pt states that she cannot come in because her son was released from the hospital but is now sick again and she will have to take him back to the hospital. Please call patient at [phone] advise.

## 2015-08-10 ENCOUNTER — Telehealth: Payer: Self-pay | Admitting: Cardiology

## 2015-08-10 NOTE — Telephone Encounter (Signed)
Left detailed voicemail message for patient to call back to reschedule upcoming stress test and that current stress test canceled per her request.

## 2015-08-10 NOTE — Telephone Encounter (Signed)
Pt is callings stating pt is not able to make her Myoview that is this week.  Please call back to reschedule.

## 2015-08-11 LAB — MICROSCOPIC EXAMINATION: WBC UA: NONE SEEN /HPF (ref 0–?)

## 2015-08-11 LAB — UA/M W/RFLX CULTURE, ROUTINE
BILIRUBIN UA: NEGATIVE
Glucose, UA: NEGATIVE
KETONES UA: NEGATIVE
LEUKOCYTES UA: NEGATIVE
NITRITE UA: NEGATIVE
PH UA: 5.5 (ref 5.0–7.5)
Protein, UA: NEGATIVE
Specific Gravity, UA: <1.005 — ABNORMAL LOW (ref 1.005–1.030)
UUROB: 0.2 mg/dL (ref 0.2–1.0)

## 2015-08-11 LAB — CBC WITH DIFFERENTIAL/PLATELET
Hematocrit: 40 % (ref 34.0–46.6)
Hemoglobin: 13.7 g/dL (ref 11.1–15.9)
LYMPHS ABS: 2.1 10*3/uL (ref 0.7–3.1)
LYMPHS: 15 %
MCH: 30.3 pg (ref 26.6–33.0)
MCHC: 34.3 g/dL (ref 31.5–35.7)
MCV: 89 fL (ref 79–97)
MID (ABSOLUTE): 0.7 10*3/uL (ref 0.1–1.6)
MID: 5 %
Neutrophils Absolute: 11.7 10*3/uL — ABNORMAL HIGH (ref 1.4–7.0)
Neutrophils: 81 %
PLATELETS: 248 10*3/uL (ref 150–379)
RBC: 4.52 x10E6/uL (ref 3.77–5.28)
RDW: 14.6 % (ref 12.3–15.4)
WBC: 14.5 10*3/uL — AB (ref 3.4–10.8)

## 2015-08-11 LAB — URINE CULTURE, REFLEX

## 2015-08-12 ENCOUNTER — Ambulatory Visit: Payer: BLUE CROSS/BLUE SHIELD

## 2015-08-13 ENCOUNTER — Other Ambulatory Visit: Payer: BLUE CROSS/BLUE SHIELD

## 2015-08-13 NOTE — Telephone Encounter (Signed)
Left message on machine for patient to contact the office.   

## 2015-08-16 NOTE — Telephone Encounter (Signed)
S/w pt who is agreeable to reschedule lexi myoview July 10, 8am. Reviewed instructions. Pt has no further questions at this time.

## 2015-08-19 ENCOUNTER — Ambulatory Visit: Payer: BLUE CROSS/BLUE SHIELD | Admitting: Cardiology

## 2015-08-24 ENCOUNTER — Ambulatory Visit
Admission: RE | Admit: 2015-08-24 | Discharge: 2015-08-24 | Disposition: A | Discharge status: Home / Self Care | Payer: BLUE CROSS/BLUE SHIELD | Source: Ambulatory Visit | Attending: Cardiology | Admitting: Cardiology

## 2015-08-24 DIAGNOSIS — R079 Chest pain, unspecified: Secondary | ICD-10-CM | POA: Diagnosis present

## 2015-08-24 DIAGNOSIS — I510 Cardiac septal defect, acquired: Secondary | ICD-10-CM | POA: Diagnosis not present

## 2015-08-24 DIAGNOSIS — I10 Essential (primary) hypertension: Secondary | ICD-10-CM | POA: Diagnosis present

## 2015-08-24 DIAGNOSIS — R931 Abnormal findings on diagnostic imaging of heart and coronary circulation: Secondary | ICD-10-CM | POA: Diagnosis not present

## 2015-08-24 MED ORDER — REGADENOSON 0.4 MG/5ML IV SOLN
0.4000 mg | Freq: Once | INTRAVENOUS | Status: AC
  Administered 2015-08-24: 0.4 mg via INTRAVENOUS

## 2015-08-24 MED ORDER — TECHNETIUM TC 99M TETROFOSMIN IV KIT
31.3000 | PACK | Freq: Once | INTRAVENOUS | Status: AC | PRN
  Administered 2015-08-24: 31.3 via INTRAVENOUS

## 2015-08-24 MED ORDER — TECHNETIUM TC 99M TETROFOSMIN IV KIT
11.0000 | PACK | Freq: Once | INTRAVENOUS | Status: AC | PRN
  Administered 2015-08-24: 11 via INTRAVENOUS

## 2015-08-25 ENCOUNTER — Other Ambulatory Visit: Payer: Self-pay

## 2015-08-25 ENCOUNTER — Ambulatory Visit (INDEPENDENT_AMBULATORY_CARE_PROVIDER_SITE_OTHER): Payer: BLUE CROSS/BLUE SHIELD

## 2015-08-25 DIAGNOSIS — R079 Chest pain, unspecified: Secondary | ICD-10-CM | POA: Diagnosis not present

## 2015-08-25 DIAGNOSIS — I10 Essential (primary) hypertension: Secondary | ICD-10-CM

## 2015-08-26 ENCOUNTER — Ambulatory Visit: Payer: BLUE CROSS/BLUE SHIELD | Admitting: Family Medicine

## 2015-09-02 ENCOUNTER — Telehealth: Payer: Self-pay | Admitting: Cardiology

## 2015-09-02 NOTE — Telephone Encounter (Signed)
Pt would like stress test results. Please call. 

## 2015-09-07 ENCOUNTER — Ambulatory Visit: Payer: BLUE CROSS/BLUE SHIELD | Admitting: Family Medicine

## 2015-09-07 ENCOUNTER — Ambulatory Visit: Payer: BLUE CROSS/BLUE SHIELD | Admitting: Cardiology

## 2015-09-10 ENCOUNTER — Telehealth: Payer: Self-pay

## 2015-09-10 NOTE — Telephone Encounter (Signed)
Pt called to inquire why she is being sent to hematology instead of rheumatology. I advised the patient that per the referral notes it was recommended by rheumatology that the patient see someone in hematology. I offered to send a note to Dr. Laural Benes for further explanation but patient stated that she will just wait until she comes in for her next office visit with Dr. Laural Benes to discuss.

## 2015-09-14 ENCOUNTER — Encounter: Payer: Self-pay | Admitting: Cardiology

## 2015-09-14 ENCOUNTER — Ambulatory Visit (INDEPENDENT_AMBULATORY_CARE_PROVIDER_SITE_OTHER): Payer: BLUE CROSS/BLUE SHIELD | Admitting: Cardiology

## 2015-09-14 VITALS — BP 120/70 | HR 80 | Ht 68.0 in | Wt 240.8 lb

## 2015-09-14 DIAGNOSIS — I10 Essential (primary) hypertension: Secondary | ICD-10-CM | POA: Diagnosis not present

## 2015-09-14 DIAGNOSIS — F17200 Nicotine dependence, unspecified, uncomplicated: Secondary | ICD-10-CM

## 2015-09-14 DIAGNOSIS — E669 Obesity, unspecified: Secondary | ICD-10-CM | POA: Diagnosis not present

## 2015-09-14 DIAGNOSIS — I2510 Atherosclerotic heart disease of native coronary artery without angina pectoris: Secondary | ICD-10-CM

## 2015-09-14 DIAGNOSIS — E785 Hyperlipidemia, unspecified: Secondary | ICD-10-CM

## 2015-09-14 MED ORDER — ISOSORBIDE MONONITRATE ER 30 MG PO TB24: 30.0000 mg | ORAL_TABLET | Freq: Every day | ORAL | 6 refills | Status: DC

## 2015-09-14 MED ORDER — AMLODIPINE BESYLATE 5 MG PO TABS: 5.0000 mg | ORAL_TABLET | Freq: Every day | ORAL | 6 refills | Status: DC

## 2015-09-14 NOTE — Progress Notes (Signed)
Cardiology Office Note   Date:  09/14/2015   ID:  Brittany, Zavala Oct 22, 1965, MRN [id number]  Referring Doctor:  Olevia Perches, DO   Cardiologist:   Almond Lint, MD   Reason for consultation:  Chief Complaint  Patient presents with  . Follow-up    some sob with exertion. no cp or swelling.      History of Present Illness: Brittany Zavala is a 50 y.o. female who Presents after testing    Since last visit, she has had no recurrence of chest pain. Some shortness breath not as the worse as before. Occasional twinges in the chest.  She has been dealing with elevation in her blood pressure. She feels that since cutting down on her smoking, her blood pressure has significantly improved. She will continue to try to quit smoking.  As mentioned previously, she had previously seen Dr. Gwen Pounds for issues with chest pain and family history of CAD. She underwent the exercise stress test. It was found to be abnormal and she was then set up for heart cath. She remembers being told that she has mild blockages that did not need any stenting. She was then started on medications. She has not been taking aspirin daily.  Patient denies fever, cough, colds, abdominal pain. No PND, orthopnea, edema.   ROS:  Please see the history of present illness. Aside from mentioned under HPI, all other systems are reviewed and negative.     Past Medical History:  Diagnosis Date  . ANA positive Oct 2015   1:160 speckled  . Anxiety   . CAD (coronary artery disease)   . Chronic UTI   . Current use of insulin (HCC)   . Depression   . Furuncle of right axilla   . Gall bladder polyp 3/16   On UA- needs follow up scan 6 months later, (9/16)  . Gallbladder polyp March 2016   needs f/u scan in Sept 2016  . Hematuria   . Hyperlipidemia   . Hypertension   . Insomnia   . Insomnia   . Tobacco abuse   . Type 1 diabetes mellitus (HCC)     Past Surgical History:  Procedure  Laterality Date  . APPENDECTOMY    . CARDIAC CATHETERIZATION    . CESAREAN SECTION    . CESAREAN SECTION     x2  . NASAL SINUS SURGERY    . TONSILECTOMY/ADENOIDECTOMY WITH MYRINGOTOMY       reports that she has been smoking Cigarettes.  She has been smoking about 0.50 packs per day. She has never used smokeless tobacco. She reports that she does not drink alcohol or use drugs.   family history includes Addison's disease in her mother; Allergies in her daughter; Asthma in her daughter; Cancer in her mother and sister; Dementia in her mother; Diabetes in her mother; Heart disease in her father and mother; Stroke in her mother.   Current Outpatient Prescriptions  Medication Sig Dispense Refill  . albuterol (PROVENTIL) (2.5 MG/3ML) 0.083% nebulizer solution Take 3 mLs (2.5 mg total) by nebulization every 6 (six) hours as needed for wheezing or shortness of breath. 150 mL 1  . aspirin EC 81 MG tablet Take 1 tablet (81 mg total) by mouth daily. 90 tablet 3  . BD PEN NEEDLE NANO U/F 32G X 4 MM MISC See admin instructions. Use as directed.    . escitalopram (LEXAPRO) 5 MG tablet Take 1 tablet (5 mg total) by mouth daily. 30 tablet  1  . HUMALOG 100 UNIT/ML injection     . hydrOXYzine (ATARAX/VISTARIL) 25 MG tablet TAKE ONE TABLET AT BEDTIME AS NEEDED 30 tablet 7  . Insulin Human (INSULIN PUMP) SOLN Inject into the skin continuous. Use as directed continuously.    . metoprolol succinate (TOPROL-XL) 25 MG 24 hr tablet Take 1.5 tablets (37.5 mg total) by mouth daily. 45 tablet 6  . omeprazole (PRILOSEC) 20 MG capsule TAKE ONE CAPSULE BY MOUTH ONCE DAILY 30 capsule 6  . ONE TOUCH ULTRA TEST test strip 1 each by Other route 4 (four) times daily.     . quinapril (ACCUPRIL) 40 MG tablet Take 1 tablet (40 mg total) by mouth daily. 30 tablet 6  . rosuvastatin (CRESTOR) 10 MG tablet TAKE ONE TABLET AT BEDTIME FOR CHOLESTEROL 30 tablet 6  . amLODipine (NORVASC) 5 MG tablet Take 1 tablet (5 mg total) by mouth  daily. 30 tablet 6  . isosorbide mononitrate (IMDUR) 30 MG 24 hr tablet Take 1 tablet (30 mg total) by mouth daily. 30 tablet 6   No current facility-administered medications for this visit.     Allergies: Morphine and related; Sulfa antibiotics; Tramadol; Vicodin [hydrocodone-acetaminophen]; and Septra [sulfamethoxazole-trimethoprim]    PHYSICAL EXAM: VS:  BP 120/70 (BP Location: Left Arm, Patient Position: Sitting, Cuff Size: Large)   Pulse 80   Ht 5\' 8"  (1.727 m)   Wt 240 lb 12.8 oz (109.2 kg)   BMI 36.61 kg/m  , Body mass index is 36.61 kg/m. Wt Readings from Last 3 Encounters:  09/14/15 240 lb 12.8 oz (109.2 kg)  08/06/15 241 lb (109.3 kg)  08/04/15 239 lb 8 oz (108.6 kg)    GENERAL:  well developed, well nourished, obese, not in acute distress HEENT: normocephalic, pink conjunctivae, anicteric sclerae, no xanthelasma, normal dentition, oropharynx clear NECK:  no neck vein engorgement, JVP normal, no hepatojugular reflux, carotid upstroke brisk and symmetric, no bruit, no thyromegaly, no lymphadenopathy LUNGS:  good respiratory effort, clear to auscultation bilaterally CV:  PMI not displaced, no thrills, no lifts, S1 and S2 within normal limits, no palpable S3 or S4, no murmurs, no rubs, no gallops ABD:  Soft, nontender, nondistended, normoactive bowel sounds, no abdominal aortic bruit, no hepatomegaly, no splenomegaly MS: nontender back, no kyphosis, no scoliosis, no joint deformities EXT:  2+ DP/PT pulses, no edema, no varicosities, no cyanosis, no clubbing SKIN: warm, nondiaphoretic, normal turgor, no ulcers NEUROPSYCH: alert, oriented to person, place, and time, sensory/motor grossly intact, normal mood, appropriate affect  Recent Labs: 03/25/2015: TSH 0.839 06/25/2015: ALT 18; BUN 7; Creatinine, Ser 0.75; Hemoglobin 13.3; Potassium 4.2; Sodium 139 08/06/2015: Platelets 248   Lipid Panel    Component Value Date/Time   CHOL 147 03/25/2015 1404   TRIG 119 03/25/2015 1404     HDL 48 03/25/2015 1404   LDLCALC 75 03/25/2015 1404     Other studies Reviewed:  EKG:  The ekg from 08/04/2015 was personally reviewed by me and it revealed sinus rhythm, 73 bpm nonspecific ST-T wave changes.  Additional studies/ records that were reviewed personally reviewed by me today include:  Echo 08/25/2015: Left ventricle:  The cavity size was normal. Wall thickness was normal. Systolic function was normal. The estimated ejection fraction was in the range of 60% to 65%. Wall motion was normal; there were no regional wall motion abnormalities. The transmitral flow pattern was normal. The deceleration time of the early transmitral flow velocity was normal. The pulmonary vein flow pattern was normal. The  tissue Doppler parameters were normal. Left ventricular diastolic function parameters were normal.  Nuclear stress test 08/24/2015:  T wave inversion was noted during stress in the III and V3 leads.  There was no ST segment deviation noted during stress.  Defect 1: There is a medium defect of mild severity in the distal anterior and apical area with minimal reversibility. This is likely due to breast attenuation. However, mild distal anterior wall ischemic can not be excluded.  The left ventricular ejection fraction is normal (55-65%).  This test is equivcoal and very suboptimal due to GI uptake and breast attenuation.    ASSESSMENT AND PLAN:  Chest pain CAD Atypical features.  However, she is known to have CAD based on cardiac cath 2014: Dr. Darrold Junker: 1 vessel CAD with 95% stenosis small caliber distal PDA with collaterals In light of mild abnormality on nuclear stress test with breast attenuation versus mild distal anterior wall ischemia, recommend trial with Imdur ER 30 mg by mouth daily Continue aspirin 81 mg by mouth daily. Continue beta blocker, ACE inhibitor, statin therapy. Patient advised to monitor for side effects including headache. Patient to follow-up  with [place] for further evaluation after medication changes.  Hypertension Currently blood pressure within normal limits. PCP at adjusting her medication doses.  Hyperlipidemia LDL goal is less than 70 due to diabetes and known CAD.  Tobacco use Commended on trying to quit smoking. We discussed the importance of smoking cessation and different strategies for quitting.   Obesity Body mass index is 36.61 kg/m.Marland Kitchen Recommend aggressive weight loss through diet and increased physical activity.    Current medicines are reviewed at length with the patient today.  The patient does not have concerns regarding medicines.  Labs/ tests ordered today include:  No orders of the defined types were placed in this encounter.   I had a lengthy and detailed discussion with the patient regarding diagnoses, prognosis, diagnostic options, treatment options , and side effects of medications.   I counseled the patient on importance of lifestyle modification including heart healthy diet, regular physical activity , and smoking cessation.   Disposition:   FU with undersigned in 3 months   Signed, Almond Lint, MD  09/14/2015 11:06 AM     [facility]

## 2015-09-14 NOTE — Patient Instructions (Signed)
Medication Instructions:  Your physician has recommended you make the following change in your medication:  1. Decrease Amlodipine to 5 mg once daily 2. Imdur 30 mg once daily  Follow-Up: Your physician recommends that you schedule a follow-up appointment in: 2 months with Dr. Alvino Chapel.  It was a pleasure seeing you today here in the office. Please do not hesitate to give [place] a call back if you have any further questions. [phone]  [place] Cellar RN, BSN

## 2015-09-16 ENCOUNTER — Ambulatory Visit (INDEPENDENT_AMBULATORY_CARE_PROVIDER_SITE_OTHER): Payer: BLUE CROSS/BLUE SHIELD | Admitting: Family Medicine

## 2015-09-16 ENCOUNTER — Encounter: Payer: Self-pay | Admitting: Family Medicine

## 2015-09-16 ENCOUNTER — Other Ambulatory Visit: Payer: Self-pay | Admitting: Family Medicine

## 2015-09-16 VITALS — BP 125/74 | HR 90 | Temp 98.6°F | Wt 241.0 lb

## 2015-09-16 DIAGNOSIS — F419 Anxiety disorder, unspecified: Secondary | ICD-10-CM | POA: Diagnosis not present

## 2015-09-16 DIAGNOSIS — I10 Essential (primary) hypertension: Secondary | ICD-10-CM | POA: Diagnosis not present

## 2015-09-16 MED ORDER — NITROFURANTOIN MACROCRYSTAL 100 MG PO CAPS: ORAL_CAPSULE | ORAL | 6 refills | Status: DC

## 2015-09-16 NOTE — Assessment & Plan Note (Signed)
Better on current regimen. Continue current regimen. Continue to monitor. Call with any concerns.

## 2015-09-16 NOTE — Assessment & Plan Note (Signed)
Under good control. Going to start the imdur tonight. Call with any concerns.

## 2015-09-16 NOTE — Progress Notes (Signed)
BP 125/74 (BP Location: Left Arm, Patient Position: Sitting, Cuff Size: Large)   Pulse 90   Temp 98.6 F (37 C)   Wt 241 lb (109.3 kg)   SpO2 98%   BMI 36.64 kg/m    Subjective:    Patient ID: Brittany Zavala, female    DOB: 1965/10/22, 50 y.o.   MRN: [id number]  HPI: Brittany Zavala is a 50 y.o. female  Chief Complaint  Patient presents with  . Depression  . Anxiety   DEPRESSION- started the lexapro, helping the anxiety but feeling really tired Mood status: better Satisfied with current treatment?: yes Symptom severity: moderate  Duration of current treatment : months Side effects: no Medication compliance: excellent compliance Psychotherapy/counseling: no  Depressed mood: yes Anxious mood: yes Anhedonia: no Significant weight loss or gain: no Insomnia: no  Fatigue: yes Feelings of worthlessness or guilt: no Impaired concentration/indecisiveness: no Suicidal ideations: no Hopelessness: no Crying spells: no Depression screen [facility] 2/9 09/16/2015 03/25/2015  Decreased Interest 0 2  Down, Depressed, Hopeless 1 1  PHQ - 2 Score 1 3  Altered sleeping 1 2  Tired, decreased energy 1 2  Change in appetite 1 1  Feeling bad or failure about yourself  1 1  Trouble concentrating 0 2  Moving slowly or fidgety/restless 0 1  Suicidal thoughts 0 0  PHQ-9 Score 5 12  Difficult doing work/chores - Somewhat difficult   HYPERTENSION Hypertension status: controlled  Satisfied with current treatment? yes Duration of hypertension: chronic BP monitoring frequency:  a few times a day BP medication side effects:  no Medication compliance: excellent compliance Aspirin: no Recurrent headaches: no Visual changes: no Palpitations: no Dyspnea: no Chest pain: no Lower extremity edema: no Dizzy/lightheaded: no  Relevant past medical, surgical, family and social history reviewed and updated as indicated. Interim medical history since our last visit  reviewed. Allergies and medications reviewed and updated.  Review of Systems  Constitutional: Negative.   Respiratory: Negative.   Cardiovascular: Negative.   Psychiatric/Behavioral: Negative.     Per HPI unless specifically indicated above     Objective:    BP 125/74 (BP Location: Left Arm, Patient Position: Sitting, Cuff Size: Large)   Pulse 90   Temp 98.6 F (37 C)   Wt 241 lb (109.3 kg)   SpO2 98%   BMI 36.64 kg/m   Wt Readings from Last 3 Encounters:  09/16/15 241 lb (109.3 kg)  09/14/15 240 lb 12.8 oz (109.2 kg)  08/06/15 241 lb (109.3 kg)    Physical Exam  Constitutional: She is oriented to person, place, and time. She appears well-developed and well-nourished. No distress.  HENT:  Head: Normocephalic and atraumatic.  Right Ear: Hearing normal.  Left Ear: Hearing normal.  Nose: Nose normal.  Eyes: Conjunctivae and lids are normal. Right eye exhibits no discharge. Left eye exhibits no discharge. No scleral icterus.  Cardiovascular: Normal rate, regular rhythm, normal heart sounds and intact distal pulses.  Exam reveals no gallop and no friction rub.   No murmur heard. Pulmonary/Chest: Effort normal and breath sounds normal. No respiratory distress. She has no wheezes. She has no rales. She exhibits no tenderness.  Musculoskeletal: Normal range of motion.  Neurological: She is alert and oriented to person, place, and time.  Skin: Skin is warm, dry and intact. No rash noted. She is not diaphoretic. No erythema. No pallor.  Psychiatric: She has a normal mood and affect. Her speech is normal and behavior is normal. Judgment  and thought content normal. Cognition and memory are normal.  Nursing note and vitals reviewed.   Results for orders placed or performed during the hospital encounter of 08/24/15  NM Myocar Multi W/Spect W/Wall Motion / EF  Result Value Ref Range   Rest HR 67 bpm   Rest BP 117/57 mmHg   Exercise duration (min)  min   Exercise duration (sec)   sec   Estimated workload  METS   Peak HR  bpm   Peak BP 147/47 mmHg   MPHR  bpm   Percent HR  %   RPE     LV sys vol 20 mL   TID 1.49    LV dias vol 70 46 - 106 mL   LHR     SSS 0    SRS 3    SDS 0       Assessment & Plan:   Problem List Items Addressed This Visit      Cardiovascular and Mediastinum   HTN (hypertension) - Primary    Under good control. Going to start the imdur tonight. Call with any concerns.         Other   Anxiety    Better on current regimen. Continue current regimen. Continue to monitor. Call with any concerns.        Other Visit Diagnoses   None.      Follow up plan: Return in about 3 months (around 12/17/2015) for Cholesterol.

## 2015-09-17 LAB — HEMOGLOBIN A1C: Hemoglobin A1C: 6.7

## 2015-09-20 ENCOUNTER — Other Ambulatory Visit: Payer: Self-pay | Admitting: Family Medicine

## 2015-10-07 LAB — NM MYOCAR MULTI W/SPECT W/WALL MOTION / EF
CHL CUP NUCLEAR SSS: 0
LV dias vol: 70 mL (ref 46–106)
LVSYSVOL: 20 mL
NUC STRESS TID: 1.49
Rest HR: 67 {beats}/min
SDS: 0
SRS: 3

## 2015-10-23 ENCOUNTER — Emergency Department: Payer: BLUE CROSS/BLUE SHIELD

## 2015-10-23 ENCOUNTER — Encounter: Payer: Self-pay | Admitting: Emergency Medicine

## 2015-10-23 ENCOUNTER — Emergency Department
Admission: EM | Admit: 2015-10-23 | Discharge: 2015-10-23 | Disposition: A | Discharge status: Home / Self Care | Payer: BLUE CROSS/BLUE SHIELD | Attending: Emergency Medicine | Admitting: Emergency Medicine

## 2015-10-23 DIAGNOSIS — K529 Noninfective gastroenteritis and colitis, unspecified: Secondary | ICD-10-CM | POA: Diagnosis not present

## 2015-10-23 DIAGNOSIS — I2510 Atherosclerotic heart disease of native coronary artery without angina pectoris: Secondary | ICD-10-CM | POA: Diagnosis not present

## 2015-10-23 DIAGNOSIS — Z794 Long term (current) use of insulin: Secondary | ICD-10-CM | POA: Insufficient documentation

## 2015-10-23 DIAGNOSIS — R109 Unspecified abdominal pain: Secondary | ICD-10-CM

## 2015-10-23 DIAGNOSIS — I10 Essential (primary) hypertension: Secondary | ICD-10-CM | POA: Insufficient documentation

## 2015-10-23 DIAGNOSIS — Z79899 Other long term (current) drug therapy: Secondary | ICD-10-CM | POA: Insufficient documentation

## 2015-10-23 DIAGNOSIS — E109 Type 1 diabetes mellitus without complications: Secondary | ICD-10-CM | POA: Insufficient documentation

## 2015-10-23 DIAGNOSIS — R1030 Lower abdominal pain, unspecified: Secondary | ICD-10-CM | POA: Diagnosis present

## 2015-10-23 DIAGNOSIS — F17210 Nicotine dependence, cigarettes, uncomplicated: Secondary | ICD-10-CM | POA: Insufficient documentation

## 2015-10-23 LAB — COMPREHENSIVE METABOLIC PANEL
ALT: 16 U/L (ref 14–54)
ANION GAP: 5 (ref 5–15)
AST: 21 U/L (ref 15–41)
Albumin: 3.5 g/dL (ref 3.5–5.0)
Alkaline Phosphatase: 69 U/L (ref 38–126)
BILIRUBIN TOTAL: 0.2 mg/dL — AB (ref 0.3–1.2)
BUN: 11 mg/dL (ref 6–20)
CALCIUM: 8.4 mg/dL — AB (ref 8.9–10.3)
CHLORIDE: 107 mmol/L (ref 101–111)
CO2: 21 mmol/L — ABNORMAL LOW (ref 22–32)
Creatinine, Ser: 0.68 mg/dL (ref 0.44–1.00)
Glucose, Bld: 230 mg/dL — ABNORMAL HIGH (ref 65–99)
POTASSIUM: 4.4 mmol/L (ref 3.5–5.1)
Sodium: 133 mmol/L — ABNORMAL LOW (ref 135–145)
Total Protein: 6.8 g/dL (ref 6.5–8.1)

## 2015-10-23 LAB — APTT: aPTT: 38 seconds — ABNORMAL HIGH (ref 24–36)

## 2015-10-23 LAB — CBC
HEMATOCRIT: 38.5 % (ref 35.0–47.0)
Hemoglobin: 12.9 g/dL (ref 12.0–16.0)
MCH: 28.6 pg (ref 26.0–34.0)
MCHC: 33.5 g/dL (ref 32.0–36.0)
MCV: 85.3 fL (ref 80.0–100.0)
PLATELETS: 209 10*3/uL (ref 150–440)
RBC: 4.51 MIL/uL (ref 3.80–5.20)
RDW: 15.1 % — AB (ref 11.5–14.5)
WBC: 17.5 10*3/uL — AB (ref 3.6–11.0)

## 2015-10-23 LAB — PROTIME-INR
INR: 0.93
PROTHROMBIN TIME: 12.5 s (ref 11.4–15.2)

## 2015-10-23 MED ORDER — IOPAMIDOL (ISOVUE-370) INJECTION 76%
100.0000 mL | Freq: Once | INTRAVENOUS | Status: AC | PRN
  Administered 2015-10-23: 100 mL via INTRAVENOUS

## 2015-10-23 MED ORDER — SODIUM CHLORIDE 0.9 % IV BOLUS (SEPSIS)
1000.0000 mL | Freq: Once | INTRAVENOUS | Status: AC
  Administered 2015-10-23: 1000 mL via INTRAVENOUS

## 2015-10-23 MED ORDER — ONDANSETRON HCL 4 MG/2ML IJ SOLN
4.0000 mg | Freq: Once | INTRAMUSCULAR | Status: AC
  Administered 2015-10-23: 4 mg via INTRAVENOUS
  Filled 2015-10-23: qty 2

## 2015-10-23 MED ORDER — ONDANSETRON 4 MG PO TBDP: 4.0000 mg | ORAL_TABLET | Freq: Four times a day (QID) | ORAL | 0 refills | Status: AC | PRN

## 2015-10-23 MED ORDER — AMOXICILLIN-POT CLAVULANATE 875-125 MG PO TABS: 1.0000 | ORAL_TABLET | Freq: Two times a day (BID) | ORAL | 0 refills | Status: DC

## 2015-10-23 MED ORDER — IOPAMIDOL (ISOVUE-300) INJECTION 61%: 100.0000 mL | Freq: Once | INTRAVENOUS | Status: DC | PRN

## 2015-10-23 NOTE — ED Notes (Signed)
This RN called to bedside by patient. Pt states she has not had another bowel movement since the first. Pt requesting to know results from bloodwork, this RN explained to patient that I could not explain results and that MD would have to. Pt states, "Well I just want to know if this catscan is really necessary because I've had 5 this year." This RN explained delay to patient and that I would speak with MD regarding blood work results. NAD noted. Pt resting in bed with husband at bedside at this time.

## 2015-10-23 NOTE — ED Notes (Signed)
Pt taken to CT at this time.

## 2015-10-23 NOTE — ED Triage Notes (Signed)
Pt to ed with c/o elevated blood pressure last night,  States she took extra blood pressure pill.  Then this am low blood pressure and diarrhea and now bloody stools noted per pt.

## 2015-10-23 NOTE — ED Notes (Signed)
MD notified of patient's request to see him. MD to bedside to answer pt's questions.

## 2015-10-23 NOTE — ED Provider Notes (Signed)
[facility] Emergency Department Provider Note   ____________________________________________   First MD Initiated Contact with Patient 10/23/15 1506     (approximate)  I have reviewed the triage vital signs and the nursing notes.   HISTORY  Chief Complaint Hypotension and Rectal Bleeding    HPI Analyah Zavala is a 50 y.o. female reports that she had the feeling that she gets when her blood pressure is high this morning, she therefore took a neck strain Norvasc tablet, about an hour after that she began feeling lightheaded and checked her blood pressure and it was in the 70s. She had previously checked it and it was 180.  Thereafter she began experiencing a crampy lower abdominal discomfort, which she reports has largely gone away now but she had 7 date loose watery stools with a small amount of blood noted within them.  No fevers or chills. No vomiting. Mild nausea.  No recent infection, did have a tooth pulled recently but was not any antibiotic. She had been taking ibuprofen for that. History of any bleeding ulcers. Currently reports a mild ache discomfort in the mid abdomen, but reports pain overall is much better than it was earlier.  Patient estimates she had about 7-10 watery stools earlier. No recent trips or travel.   Past Medical History:  Diagnosis Date  . ANA positive Oct 2015   1:160 speckled  . Anxiety   . CAD (coronary artery disease)   . Chronic UTI   . Current use of insulin (HCC)   . Depression   . Furuncle of right axilla   . Gall bladder polyp 3/16   On UA- needs follow up scan 6 months later, (9/16)  . Gallbladder polyp March 2016   needs f/u scan in Sept 2016  . Hematuria   . Hyperlipidemia   . Hypertension   . Insomnia   . Insomnia   . Tobacco abuse   . Type 1 diabetes mellitus [facility])     Patient Active Problem List   Diagnosis Date Noted  . Family history of pheochromocytoma 01/22/2015  . Pulmonary  nodules/lesions, multiple 01/11/2015  . Hypoglycemia associated with diabetes (HCC) 08/27/2014  . Acute hemorrhagic enterocolitis (HCC) 08/27/2014  . Leukocytosis 08/27/2014  . Abdominal pain 08/27/2014  . Campylobacter enteritis 08/27/2014  . GI bleed 08/25/2014  . Anxiety 08/25/2014  . HLD (hyperlipidemia) 08/25/2014  . HTN (hypertension) 08/25/2014  . GERD (gastroesophageal reflux disease) 08/25/2014  . Type 1 diabetes (HCC) 08/25/2014  . Tobacco abuse   . Current use of insulin (HCC)   . Insomnia   . CAD (coronary artery disease)   . Gallbladder polyp 04/14/2014  . ANA positive 11/13/2013    Past Surgical History:  Procedure Laterality Date  . APPENDECTOMY    . CARDIAC CATHETERIZATION    . CESAREAN SECTION    . CESAREAN SECTION     x2  . NASAL SINUS SURGERY    . TONSILECTOMY/ADENOIDECTOMY WITH MYRINGOTOMY      Prior to Admission medications   Medication Sig Start Date End Date Taking? Authorizing Provider  albuterol (PROVENTIL) (2.5 MG/3ML) 0.083% nebulizer solution Take 3 mLs (2.5 mg total) by nebulization every 6 (six) hours as needed for wheezing or shortness of breath. 01/04/15   Megan P Johnson, DO  amLODipine (NORVASC) 5 MG tablet Take 1 tablet (5 mg total) by mouth daily. 09/14/15 12/13/15  Almond Lint, MD  amoxicillin-clavulanate (AUGMENTIN) 875-125 MG tablet Take 1 tablet by mouth 2 (two) times  daily. 10/23/15   Sharyn Creamer, MD  BD PEN NEEDLE NANO U/F 32G X 4 MM MISC See admin instructions. Use as directed. 03/06/14   Historical Provider, MD  escitalopram (LEXAPRO) 5 MG tablet TAKE 1 TABLET EVERY DAY 09/16/15   Megan P Johnson, DO  HUMALOG 100 UNIT/ML injection  06/10/15   Historical Provider, MD  hydrOXYzine (ATARAX/VISTARIL) 25 MG tablet TAKE ONE TABLET AT BEDTIME AS NEEDED 09/21/14   Steele Sizer, MD  Insulin Human (INSULIN PUMP) SOLN Inject into the skin continuous. Use as directed continuously.    Historical Provider, MD  isosorbide mononitrate (IMDUR) 30 MG 24 hr  tablet Take 1 tablet (30 mg total) by mouth daily. 09/14/15   Almond Lint, MD  ketoconazole (NIZORAL) 2 % shampoo APPLY TO AFFECTED AREAS AND LATHER LET SIT FOR 5 MINUTES THEN RINSE REPEAT ONCE A DAY FOR ONE WEEK THEN USE ONLY ONCE A WEEK 09/20/15   Megan P Johnson, DO  metoprolol succinate (TOPROL-XL) 25 MG 24 hr tablet Take 1.5 tablets (37.5 mg total) by mouth daily. 07/27/15   Megan P Johnson, DO  nitrofurantoin (MACRODANTIN) 100 MG capsule Take within 30 minutes of having sex. If not getting better call. 09/16/15   Megan P Johnson, DO  omeprazole (PRILOSEC) 20 MG capsule TAKE ONE CAPSULE BY MOUTH ONCE DAILY 06/17/15   Megan P Johnson, DO  ondansetron (ZOFRAN ODT) 4 MG disintegrating tablet Take 1 tablet (4 mg total) by mouth every 6 (six) hours as needed for nausea or vomiting. 10/23/15   Sharyn Creamer, MD  ONE TOUCH ULTRA TEST test strip 1 each by Other route 4 (four) times daily.  03/06/14   Historical Provider, MD  quinapril (ACCUPRIL) 40 MG tablet Take 1 tablet (40 mg total) by mouth daily. 07/27/15   Megan P Johnson, DO  rosuvastatin (CRESTOR) 10 MG tablet TAKE ONE TABLET AT BEDTIME FOR CHOLESTEROL 07/21/15   Megan P Johnson, DO    Allergies Morphine and related; Sulfa antibiotics; Tramadol; Vicodin [hydrocodone-acetaminophen]; and Septra [sulfamethoxazole-trimethoprim]  Family History  Problem Relation Age of Onset  . Cancer Mother     breast  . Stroke Mother   . Dementia Mother   . Addison's disease Mother   . Diabetes Mother   . Heart disease Mother   . Heart disease Father   . Cancer Sister     breast  . Asthma Daughter   . Allergies Daughter     Social History Social History  Substance Use Topics  . Smoking status: Current Every Day Smoker    Packs/day: 0.50    Types: Cigarettes  . Smokeless tobacco: Never Used  . Alcohol use No    Review of Systems Constitutional: No fever/chills Eyes: No visual changes. ENT: No sore throat. Cardiovascular: Denies chest pain. Respiratory:  Denies shortness of breath. Gastrointestinal: See history of present illness Genitourinary: Negative for dysuria. Musculoskeletal: Negative for back pain. Skin: Negative for rash. Neurological: Negative for headaches, focal weakness or numbness.  10-point ROS otherwise negative.  ____________________________________________   PHYSICAL EXAM:  VITAL SIGNS: ED Triage Vitals [10/23/15 1455]  Enc Vitals Group     BP 118/66     Pulse Rate 61     Resp 18     Temp 98.1 F (36.7 C)     Temp Source Oral     SpO2 100 %     Weight 235 lb (106.6 kg)     Height 5\' 8"  (1.727 m)     Head Circumference  Peak Flow      Pain Score 4     Pain Loc      Pain Edu?      Excl. in GC?     Constitutional: Alert and oriented. Well appearing and in no acute distress. Eyes: Conjunctivae are normal. PERRL. EOMI. Head: Atraumatic. Nose: No congestion/rhinnorhea. Mouth/Throat: Mucous membranes are Slightly dry.  Oropharynx non-erythematous. Neck: No stridor.   Cardiovascular: Normal rate, regular rhythm. Grossly normal heart sounds.  Good peripheral circulation. Respiratory: Normal respiratory effort.  No retractions. Lungs CTAB. Gastrointestinal: Soft and nontender except for some minimal discomfort over the left mid to lower abdomen with no rebound or guarding. No distention. No abdominal bruits. No CVA tenderness. Patient had a small watery bowel movement, no blood noted. No abnormal foul odor noted. Musculoskeletal: No lower extremity tenderness nor edema.   Neurologic:  Normal speech and language. No gross focal neurologic deficits are appreciated. No gait instability. Skin:  Skin is warm, dry and intact. No rash noted. Psychiatric: Mood and affect are normal. Speech and behavior are normal.  ____________________________________________   LABS (all labs ordered are listed, but only abnormal results are displayed)  Labs Reviewed  CBC - Abnormal; Notable for the following:       Result  Value   WBC 17.5 (*)    RDW 15.1 (*)    All other components within normal limits  COMPREHENSIVE METABOLIC PANEL - Abnormal; Notable for the following:    Sodium 133 (*)    CO2 21 (*)    Glucose, Bld 230 (*)    Calcium 8.4 (*)    Total Bilirubin 0.2 (*)    All other components within normal limits  APTT - Abnormal; Notable for the following:    aPTT 38 (*)    All other components within normal limits  GASTROINTESTINAL PANEL BY PCR, STOOL (REPLACES STOOL CULTURE)  C DIFFICILE QUICK SCREEN W PCR REFLEX  PROTIME-INR   ____________________________________________  EKG   ____________________________________________  RADIOLOGY  Ct Angio Abd/pel W/ And/or W/o  Result Date: 10/23/2015 CLINICAL DATA:  Hypotension with bloody stools. History of appendectomy. EXAM: CTA ABDOMEN AND PELVIS wITHOUT AND WITH CONTRAST TECHNIQUE: Multidetector CT imaging of the abdomen and pelvis was performed using the standard protocol during bolus administration of intravenous contrast. Multiplanar reconstructed images and MIPs were obtained and reviewed to evaluate the vascular anatomy. CONTRAST:  100 mL of Isovue 370 COMPARISON:  August 25, 2014 FINDINGS: VASCULAR Aorta: Normal caliber aorta without aneurysm, dissection, vasculitis or significant stenosis. Mild atherosclerotic changes, particularly distally. Celiac: Patent without evidence of aneurysm, dissection, vasculitis or significant stenosis. SMA: Patent without evidence of aneurysm, dissection, vasculitis or significant stenosis. Renals: There are 3 renal arteries on the left and 2 on the right without significant stenosis. IMA: Patent without evidence of aneurysm, dissection, vasculitis or significant stenosis. Inflow: Patent without evidence of aneurysm, dissection, vasculitis or significant stenosis. Proximal Outflow: Mild atherosclerotic changes in the iliac vessels with no significant stenosis Veins: No obvious venous abnormality within the limitations  of this arterial phase study. Review of the MIP images confirms the above findings. NON-VASCULAR Lower chest: Pulmonary nodules abutting the pleura in the right lower lobe on series 8, image 18, were better assessed on previous CT scans of the chest but are unchanged. Hepatobiliary: Focal fatty deposition adjacent to the falciform ligament. There is tubular low attenuation in the right hepatic lobe on series 5, images 52 through 58. The vessels in this region appear to be  well opacified and this is not thought to be a thrombosed vessel. In the absence of acute trauma, this should not be a laceration. This may simply represent scarring. The finding is better seen today but may have been present previously when accounting for difference in technique. The liver, gallbladder, and portal vein are otherwise normal. Pancreas: Normal Spleen: Low-attenuation in the spleen is of doubtful significance, likely a hemangioma or cyst. This was seen previously. Adrenals/Urinary Tract: Normal Stomach/Bowel: There is a moderate-sized hiatal hernia. The stomach is otherwise within normal limits. The small bowel is normal. There is a fatty extension off of the distal iliac ileum as seen on series 5, image 125 with no associated stranding. This is not an acute or significant abnormality. The small bowel is otherwise normal. Evaluation of colon is limited due to lack of distention with oral contrast. The descending colon in particular is decompressed limiting evaluation. The colon is normal in appearance from the cecum to the proximal descending colon. There appears to be minimal increased attenuation in the fat around the distal half of the descending colon. The sigmoid colon and rectum are normal appearance. The possible minimal fat stranding adjacent to the descending colon is best seen on coronal image 80. The appendix has been surgically removed. Lymphatic: Normal Reproductive: No acute abnormalities. Musculoskeletal: There is a fat  containing umbilical hernia. No acute bony abnormalities. Other: No other abnormalities. IMPRESSION: VASCULAR 1. Mild atherosclerotic change in the distal aorta and iliac vessels with no cause for the patient's symptoms identified. No significant stenosis. No aneurysm or dissection. NON-VASCULAR 1. Mild increased attenuation in the fat around the distal descending colon is suspected. This finding is very subtle an the colon is not well evaluated in this region due to lack of distention. However, given history, this could be sequela of a subtle mild colitis which could be inflammatory, infectious, or vascular. 2. Linear low-attenuation in the right hepatic lobe described above. This does not appear to represent a thrombosed vascular structure. This may have been present previously when accounting for difference in technique. This may represent scarring. 3. Stable pulmonary nodules. These were better assessed on the CT of the chest from February 2017. A 12 month follow-up CT scan of the chest was recommended at that time to follow these nodules. Electronically Signed   By: Gerome Samavid  Williams III M.D   On: 10/23/2015 18:18    ____________________________________________   PROCEDURES  Procedure(s) performed: None  Procedures  Critical Care performed: No  ____________________________________________   INITIAL IMPRESSION / ASSESSMENT AND PLAN / ED COURSE  Pertinent labs & imaging results that were available during my care of the patient were reviewed by me and considered in my medical decision making (see chart for details).  Patient presents for evaluation of crampy abdominal discomfort which reportedly she saw small little blood in earlier. Afebrile, but the patient does have some mild left middle abdominal discomfort. She reports is much better now, and I am somewhat concerned that this seems to have occurred in the setting of an episode of hypotension which the patient had evaluated at home. The  patient had elevated blood pressure earlier, and thus took neck show Norvasc tablet which may be the cause of her hypotension most really other causes such as acute anemia, infectious etiologies are all considered. She is normotensive with reassuring and stable vital signs here. Nontoxic and overall well appearing. We'll obtain abdominal labs, and plan to obtain CT angiography via mesenteric protocol to evaluate for  possible etiology including consideration for ischemic colitis given the associated hypotension which the patient described.  ----------------------------------------- 4:41 PM on 10/23/2015 -----------------------------------------  Labs are notable for leukocytosis. Patient has no neurologic, cardiac, or pulmonary complaint. Reports pain is improved, but does have mild nausea. We'll hydrate and continue to await CT results. Make likely stable  Clinical Course   ----------------------------------------- 7:57 PM on 10/23/2015 -----------------------------------------  The patient reports no further diarrhea while in the ER, all abdominal pain is resolved and nausea improved. She is requesting to be discharged and states she feels much better. I believe, that I cannot be certain, that she may have had an episode of ischemic colitis due to mild hypotension but given there is some inflammatory change suspect and her presentation with elevated white count we did discuss and we'll put her her on Augmentin for treatment of possible diverticulitis as well. She is afebrile, she feels much better, and is resting comfortably at this time. I discussed careful return precautions as well as carefully reviewed her CT findings, she'll follow closely with gastroenterology as well as her primary care doctor.  Return precautions and treatment recommendations and follow-up discussed with the patient who is agreeable with the plan.   ____________________________________________   FINAL CLINICAL  IMPRESSION(S) / ED DIAGNOSES  Final diagnoses:  Abdominal pain  Colitis, acute      NEW MEDICATIONS STARTED DURING THIS VISIT:  New Prescriptions   AMOXICILLIN-CLAVULANATE (AUGMENTIN) 875-125 MG TABLET    Take 1 tablet by mouth 2 (two) times daily.   ONDANSETRON (ZOFRAN ODT) 4 MG DISINTEGRATING TABLET    Take 1 tablet (4 mg total) by mouth every 6 (six) hours as needed for nausea or vomiting.     Note:  This document was prepared using Dragon voice recognition software and may include unintentional dictation errors.     Sharyn Creamer, MD 10/23/15 [phone]

## 2015-10-23 NOTE — Discharge Instructions (Signed)
You have been seen in the Emergency Department (ED) for abdominal pain.  We suspect you have "colitis" an inflammatory change in your colon possible due to infection, or possibly due to poor blood flow after doubling your blood pressure medicine.  Please follow up as instructed above regarding today?s emergent visit and the symptoms that are bothering you.  Return to the ED if your abdominal pain worsens or fails to improve, you develop bloody vomiting, bloody diarrhea, you are unable to tolerate fluids due to vomiting, fever greater than 101, or other symptoms that concern you.

## 2015-11-09 NOTE — Progress Notes (Deleted)
Cardiology Office Note   Date:  11/09/2015   ID:  Brittany Zavala, DOB 07-Sep-1965, MRN [id number]  Referring Doctor:  Olevia PerchesMegan Johnson, DO   Cardiologist:   Almond LintAileen Amal Saiki, MD   Reason for consultation:  No chief complaint on file.     History of Present Illness: Brittany MinkConstance Kathleen Zavala is a 50 y.o. female who Presents after testing    Since last visit, she has had no recurrence of chest pain. Some shortness breath not as the worse as before. Occasional twinges in the chest.  She has been dealing with elevation in her blood pressure. She feels that since cutting down on her smoking, her blood pressure has significantly improved. She will continue to try to quit smoking.  As mentioned previously, she had previously seen Dr. Gwen PoundsKowalski for issues with chest pain and family history of CAD. She underwent the exercise stress test. It was found to be abnormal and she was then set up for heart cath. She remembers being told that she has mild blockages that did not need any stenting. She was then started on medications. She has not been taking aspirin daily.  Patient denies fever, cough, colds, abdominal pain. No PND, orthopnea, edema.   ROS:  Please see the history of present illness. Aside from mentioned under HPI, all other systems are reviewed and negative.     Past Medical History:  Diagnosis Date  . ANA positive Oct 2015   1:160 speckled  . Anxiety   . CAD (coronary artery disease)   . Chronic UTI   . Current use of insulin (HCC)   . Depression   . Furuncle of right axilla   . Gall bladder polyp 3/16   On UA- needs follow up scan 6 months later, (9/16)  . Gallbladder polyp March 2016   needs f/u scan in Sept 2016  . Hematuria   . Hyperlipidemia   . Hypertension   . Insomnia   . Insomnia   . Tobacco abuse   . Type 1 diabetes mellitus (HCC)     Past Surgical History:  Procedure Laterality Date  . APPENDECTOMY    . CARDIAC CATHETERIZATION    . CESAREAN  SECTION    . CESAREAN SECTION     x2  . NASAL SINUS SURGERY    . TONSILECTOMY/ADENOIDECTOMY WITH MYRINGOTOMY       reports that she has been smoking Cigarettes.  She has been smoking about 0.50 packs per day. She has never used smokeless tobacco. She reports that she does not drink alcohol or use drugs.   family history includes Addison's disease in her mother; Allergies in her daughter; Asthma in her daughter; Cancer in her mother and sister; Dementia in her mother; Diabetes in her mother; Heart disease in her father and mother; Stroke in her mother.   Current Outpatient Prescriptions  Medication Sig Dispense Refill  . albuterol (PROVENTIL) (2.5 MG/3ML) 0.083% nebulizer solution Take 3 mLs (2.5 mg total) by nebulization every 6 (six) hours as needed for wheezing or shortness of breath. 150 mL 1  . amLODipine (NORVASC) 5 MG tablet Take 1 tablet (5 mg total) by mouth daily. 30 tablet 6  . amoxicillin-clavulanate (AUGMENTIN) 875-125 MG tablet Take 1 tablet by mouth 2 (two) times daily. 20 tablet 0  . BD PEN NEEDLE NANO U/F 32G X 4 MM MISC See admin instructions. Use as directed.    . escitalopram (LEXAPRO) 5 MG tablet TAKE 1 TABLET EVERY DAY 30 tablet 6  .  HUMALOG 100 UNIT/ML injection     . hydrOXYzine (ATARAX/VISTARIL) 25 MG tablet TAKE ONE TABLET AT BEDTIME AS NEEDED 30 tablet 7  . Insulin Human (INSULIN PUMP) SOLN Inject into the skin continuous. Use as directed continuously.    . isosorbide mononitrate (IMDUR) 30 MG 24 hr tablet Take 1 tablet (30 mg total) by mouth daily. 30 tablet 6  . ketoconazole (NIZORAL) 2 % shampoo APPLY TO AFFECTED AREAS AND LATHER LET SIT FOR 5 MINUTES THEN RINSE REPEAT ONCE A DAY FOR ONE WEEK THEN USE ONLY ONCE A WEEK 120 mL 1  . metoprolol succinate (TOPROL-XL) 25 MG 24 hr tablet Take 1.5 tablets (37.5 mg total) by mouth daily. 45 tablet 6  . nitrofurantoin (MACRODANTIN) 100 MG capsule Take within 30 minutes of having sex. If not getting better call. 60 capsule 6   . omeprazole (PRILOSEC) 20 MG capsule TAKE ONE CAPSULE BY MOUTH ONCE DAILY 30 capsule 6  . ondansetron (ZOFRAN ODT) 4 MG disintegrating tablet Take 1 tablet (4 mg total) by mouth every 6 (six) hours as needed for nausea or vomiting. 20 tablet 0  . ONE TOUCH ULTRA TEST test strip 1 each by Other route 4 (four) times daily.     . quinapril (ACCUPRIL) 40 MG tablet Take 1 tablet (40 mg total) by mouth daily. 30 tablet 6  . rosuvastatin (CRESTOR) 10 MG tablet TAKE ONE TABLET AT BEDTIME FOR CHOLESTEROL 30 tablet 6   No current facility-administered medications for this visit.     Allergies: Morphine and related; Sulfa antibiotics; Tramadol; Vicodin [hydrocodone-acetaminophen]; and Septra [sulfamethoxazole-trimethoprim]    PHYSICAL EXAM: VS:  LMP 09/26/2015  , There is no height or weight on file to calculate BMI. Wt Readings from Last 3 Encounters:  10/23/15 235 lb (106.6 kg)  09/16/15 241 lb (109.3 kg)  09/14/15 240 lb 12.8 oz (109.2 kg)    GENERAL:  well developed, well nourished, obese, not in acute distress HEENT: normocephalic, pink conjunctivae, anicteric sclerae, no xanthelasma, normal dentition, oropharynx clear NECK:  no neck vein engorgement, JVP normal, no hepatojugular reflux, carotid upstroke brisk and symmetric, no bruit, no thyromegaly, no lymphadenopathy LUNGS:  good respiratory effort, clear to auscultation bilaterally CV:  PMI not displaced, no thrills, no lifts, S1 and S2 within normal limits, no palpable S3 or S4, no murmurs, no rubs, no gallops ABD:  Soft, nontender, nondistended, normoactive bowel sounds, no abdominal aortic bruit, no hepatomegaly, no splenomegaly MS: nontender back, no kyphosis, no scoliosis, no joint deformities EXT:  2+ DP/PT pulses, no edema, no varicosities, no cyanosis, no clubbing SKIN: warm, nondiaphoretic, normal turgor, no ulcers NEUROPSYCH: alert, oriented to person, place, and time, sensory/motor grossly intact, normal mood, appropriate  affect  Recent Labs: 03/25/2015: TSH 0.839 10/23/2015: ALT 16; BUN 11; Creatinine, Ser 0.68; Hemoglobin 12.9; Platelets 209; Potassium 4.4; Sodium 133   Lipid Panel    Component Value Date/Time   CHOL 147 03/25/2015 1404   TRIG 119 03/25/2015 1404   HDL 48 03/25/2015 1404   LDLCALC 75 03/25/2015 1404     Other studies Reviewed:  EKG:  The ekg from 08/04/2015 was personally reviewed by me and it revealed sinus rhythm, 73 bpm nonspecific ST-T wave changes.  Additional studies/ records that were reviewed personally reviewed by me today include:  Echo 08/25/2015: Left ventricle:  The cavity size was normal. Wall thickness was normal. Systolic function was normal. The estimated ejection fraction was in the range of 60% to 65%. Wall motion was normal;  there were no regional wall motion abnormalities. The transmitral flow pattern was normal. The deceleration time of the early transmitral flow velocity was normal. The pulmonary vein flow pattern was normal. The tissue Doppler parameters were normal. Left ventricular diastolic function parameters were normal.  Nuclear stress test 08/24/2015:  T wave inversion was noted during stress in the III and V3 leads.  There was no ST segment deviation noted during stress.  Defect 1: There is a medium defect of mild severity in the distal anterior and apical area with minimal reversibility. This is likely due to breast attenuation. However, mild distal anterior wall ischemic can not be excluded.  The left ventricular ejection fraction is normal (55-65%).  This test is equivcoal and very suboptimal due to GI uptake and breast attenuation.    ASSESSMENT AND PLAN:  Chest pain CAD Atypical features.  However, she is known to have CAD based on cardiac cath 2014: Dr. Darrold Junker: 1 vessel CAD with 95% stenosis small caliber distal PDA with collaterals In light of mild abnormality on nuclear stress test with breast attenuation versus mild distal  anterior wall ischemia, recommend trial with Imdur ER 30 mg by mouth daily Continue aspirin 81 mg by mouth daily. Continue beta blocker, ACE inhibitor, statin therapy. Patient advised to monitor for side effects including headache. Patient to follow-up with [place] for further evaluation after medication changes.  Hypertension Currently blood pressure within normal limits. PCP at adjusting her medication doses.  Hyperlipidemia LDL goal is less than 70 due to diabetes and known CAD.  Tobacco use Commended on trying to quit smoking. We discussed the importance of smoking cessation and different strategies for quitting.   Obesity There is no height or weight on file to calculate BMI.Marland Kitchen Recommend aggressive weight loss through diet and increased physical activity.    Current medicines are reviewed at length with the patient today.  The patient does not have concerns regarding medicines.  Labs/ tests ordered today include:  No orders of the defined types were placed in this encounter.   I had a lengthy and detailed discussion with the patient regarding diagnoses, prognosis, diagnostic options, treatment options , and side effects of medications.   I counseled the patient on importance of lifestyle modification including heart healthy diet, regular physical activity , and smoking cessation.   Disposition:   FU with undersigned in 3 months   Signed, Almond Lint, MD  11/09/2015 12:49 PM    [facility]

## 2015-11-16 ENCOUNTER — Ambulatory Visit: Payer: BLUE CROSS/BLUE SHIELD | Admitting: Cardiology

## 2015-11-30 ENCOUNTER — Encounter: Payer: Self-pay | Admitting: Cardiology

## 2015-11-30 ENCOUNTER — Ambulatory Visit (INDEPENDENT_AMBULATORY_CARE_PROVIDER_SITE_OTHER): Payer: BLUE CROSS/BLUE SHIELD | Admitting: Cardiology

## 2015-11-30 VITALS — BP 130/80 | HR 80 | Ht 68.0 in | Wt 245.8 lb

## 2015-11-30 DIAGNOSIS — F17200 Nicotine dependence, unspecified, uncomplicated: Secondary | ICD-10-CM

## 2015-11-30 DIAGNOSIS — I10 Essential (primary) hypertension: Secondary | ICD-10-CM

## 2015-11-30 DIAGNOSIS — I2510 Atherosclerotic heart disease of native coronary artery without angina pectoris: Secondary | ICD-10-CM

## 2015-11-30 NOTE — Progress Notes (Signed)
Cardiology Office Note   Date:  11/30/2015   ID:  Brittany, Zavala [birth year], MRN [id number]  Referring Doctor:  Olevia Perches, DO   Cardiologist:   Almond Lint, MD   Reason for consultation:  Chief Complaint  Patient presents with  . Follow-up    NO COMPLAINTS.      History of Present Illness: Brittany Zavala is a 50 y.o. female who Presents after testing    As mentioned previously, she had previously seen Dr. Gwen Pounds for issues with chest pain and family history of CAD. She underwent the exercise stress test. It was found to be abnormal and she was then set up for heart cath. She remembers being told that she has mild blockages that did not need any stenting. She was then started on medications. She has not been taking aspirin daily.  Patient reports that she continues to have occasional twinges in the chest despite being on Imdur. She however was recently dealing with possible colitis with blood in the stool. He is scheduled to see a GI specialist. Because of recent events, she is not sure now and has not really paid attention to her symptoms of chest pain  Patient denies fever, cough, colds, abdominal pain. No PND, orthopnea, edema.   ROS:  Please see the history of present illness. Aside from mentioned under HPI, all other systems are reviewed and negative.     Past Medical History:  Diagnosis Date  . ANA positive Oct 2015   1:160 speckled  . Anxiety   . CAD (coronary artery disease)   . Chronic UTI   . Current use of insulin (HCC)   . Depression   . Furuncle of right axilla   . Gall bladder polyp 3/16   On UA- needs follow up scan 6 months later, (9/16)  . Gallbladder polyp March 2016   needs f/u scan in Sept 2016  . Hematuria   . Hyperlipidemia   . Hypertension   . Insomnia   . Insomnia   . Tobacco abuse   . Type 1 diabetes mellitus (HCC)     Past Surgical History:  Procedure Laterality Date  . APPENDECTOMY    .  CARDIAC CATHETERIZATION    . CESAREAN SECTION    . CESAREAN SECTION     x2  . NASAL SINUS SURGERY    . TONSILECTOMY/ADENOIDECTOMY WITH MYRINGOTOMY       reports that she has been smoking Cigarettes.  She has been smoking about 0.50 packs per day. She has never used smokeless tobacco. She reports that she does not drink alcohol or use drugs.   family history includes Addison's disease in her mother; Allergies in her daughter; Asthma in her daughter; Cancer in her mother and sister; Dementia in her mother; Diabetes in her mother; Heart disease in her father and mother; Stroke in her mother.   Current Outpatient Prescriptions  Medication Sig Dispense Refill  . albuterol (PROVENTIL) (2.5 MG/3ML) 0.083% nebulizer solution Take 3 mLs (2.5 mg total) by nebulization every 6 (six) hours as needed for wheezing or shortness of breath. 150 mL 1  . amLODipine (NORVASC) 5 MG tablet Take 1 tablet (5 mg total) by mouth daily. 30 tablet 6  . BD PEN NEEDLE NANO U/F 32G X 4 MM MISC See admin instructions. Use as directed.    . escitalopram (LEXAPRO) 5 MG tablet TAKE 1 TABLET EVERY DAY 30 tablet 6  . HUMALOG 100 UNIT/ML injection     .  hydrOXYzine (ATARAX/VISTARIL) 25 MG tablet TAKE ONE TABLET AT BEDTIME AS NEEDED 30 tablet 7  . Insulin Human (INSULIN PUMP) SOLN Inject into the skin continuous. Use as directed continuously.    . isosorbide mononitrate (IMDUR) 30 MG 24 hr tablet Take 1 tablet (30 mg total) by mouth daily. 30 tablet 6  . ketoconazole (NIZORAL) 2 % shampoo APPLY TO AFFECTED AREAS AND LATHER LET SIT FOR 5 MINUTES THEN RINSE REPEAT ONCE A DAY FOR ONE WEEK THEN USE ONLY ONCE A WEEK 120 mL 1  . metoprolol succinate (TOPROL-XL) 25 MG 24 hr tablet Take 1.5 tablets (37.5 mg total) by mouth daily. 45 tablet 6  . nitrofurantoin (MACRODANTIN) 100 MG capsule Take within 30 minutes of having sex. If not getting better call. 60 capsule 6  . omeprazole (PRILOSEC) 20 MG capsule TAKE ONE CAPSULE BY MOUTH ONCE  DAILY 30 capsule 6  . ondansetron (ZOFRAN ODT) 4 MG disintegrating tablet Take 1 tablet (4 mg total) by mouth every 6 (six) hours as needed for nausea or vomiting. 20 tablet 0  . ONE TOUCH ULTRA TEST test strip 1 each by Other route 4 (four) times daily.     . quinapril (ACCUPRIL) 40 MG tablet Take 1 tablet (40 mg total) by mouth daily. 30 tablet 6  . rosuvastatin (CRESTOR) 10 MG tablet TAKE ONE TABLET AT BEDTIME FOR CHOLESTEROL 30 tablet 6   No current facility-administered medications for this visit.     Allergies: Morphine and related; Sulfa antibiotics; Tramadol; Vicodin [hydrocodone-acetaminophen]; and Septra [sulfamethoxazole-trimethoprim]    PHYSICAL EXAM: VS:  BP 130/80 (BP Location: Left Arm, Patient Position: Sitting, Cuff Size: Normal)   Pulse 80   Ht 5\' 8"  (1.727 m)   Wt 245 lb 12.8 oz (111.5 kg)   BMI 37.37 kg/m  , Body mass index is 37.37 kg/m. Wt Readings from Last 3 Encounters:  11/30/15 245 lb 12.8 oz (111.5 kg)  10/23/15 235 lb (106.6 kg)  09/16/15 241 lb (109.3 kg)    GENERAL:  well developed, well nourished, obese, not in acute distress HEENT: normocephalic, pink conjunctivae, anicteric sclerae, no xanthelasma, normal dentition, oropharynx clear NECK:  no neck vein engorgement, JVP normal, no hepatojugular reflux, carotid upstroke brisk and symmetric, no bruit, no thyromegaly, no lymphadenopathy LUNGS:  good respiratory effort, clear to auscultation bilaterally CV:  PMI not displaced, no thrills, no lifts, S1 and S2 within normal limits, no palpable S3 or S4, no murmurs, no rubs, no gallops ABD:  Soft, nontender, nondistended, normoactive bowel sounds, no abdominal aortic bruit, no hepatomegaly, no splenomegaly MS: nontender back, no kyphosis, no scoliosis, no joint deformities EXT:  2+ DP/PT pulses, no edema, no varicosities, no cyanosis, no clubbing SKIN: warm, nondiaphoretic, normal turgor, no ulcers NEUROPSYCH: alert, oriented to person, place, and time,  sensory/motor grossly intact, normal mood, appropriate affect  Recent Labs: 03/25/2015: TSH 0.839 10/23/2015: ALT 16; BUN 11; Creatinine, Ser 0.68; Hemoglobin 12.9; Platelets 209; Potassium 4.4; Sodium 133   Lipid Panel    Component Value Date/Time   CHOL 147 03/25/2015 1404   TRIG 119 03/25/2015 1404   HDL 48 03/25/2015 1404   LDLCALC 75 03/25/2015 1404     Other studies Reviewed:  EKG:  The ekg from 08/04/2015 was personally reviewed by me and it revealed sinus rhythm, 73 bpm nonspecific ST-T wave changes.  Additional studies/ records that were reviewed personally reviewed by me today include:  Echo 08/25/2015: Left ventricle:  The cavity size was normal. Wall thickness was  normal. Systolic function was normal. The estimated ejection fraction was in the range of 60% to 65%. Wall motion was normal; there were no regional wall motion abnormalities. The transmitral flow pattern was normal. The deceleration time of the early transmitral flow velocity was normal. The pulmonary vein flow pattern was normal. The tissue Doppler parameters were normal. Left ventricular diastolic function parameters were normal.  Nuclear stress test 08/24/2015:  T wave inversion was noted during stress in the III and V3 leads.  There was no ST segment deviation noted during stress.  Defect 1: There is a medium defect of mild severity in the distal anterior and apical area with minimal reversibility. This is likely due to breast attenuation. However, mild distal anterior wall ischemic can not be excluded.  The left ventricular ejection fraction is normal (55-65%).  This test is equivcoal and very suboptimal due to GI uptake and breast attenuation.    ASSESSMENT AND PLAN:  Chest pain CAD Atypical features.  However, she is known to have CAD based on cardiac cath 2014: Dr. Darrold JunkerParaschos: 1 vessel CAD with 95% stenosis small caliber distal PDA with collaterals In light of mild abnormality on nuclear  stress test with breast attenuation versus mild distal anterior wall ischemia Patient would like to continue monitoring her symptoms with trial with Imdur ER 30 mg by mouth daily. Follow-up in 2 months time or sooner if symptoms do not improve. Continue aspirin 81 mg by mouth daily. Continue beta blocker, ACE inhibitor, statin therapy.   Hypertension Currently blood pressure within normal limits. PCP at adjusting her medication doses.  Hyperlipidemia LDL goal is less than 70 due to diabetes and known CAD.  Tobacco use Commended on trying to quit smoking. We discussed the importance of smoking cessation and different strategies for quitting.   Obesity Body mass index is 37.37 kg/m.Marland Kitchen. Recommend aggressive weight loss through diet and increased physical activity.    Current medicines are reviewed at length with the patient today.  The patient does not have concerns regarding medicines.  Labs/ tests ordered today include:  No orders of the defined types were placed in this encounter.   I had a lengthy and detailed discussion with the patient regarding diagnoses, prognosis, diagnostic options, treatment options , and side effects of medications.   I counseled the patient on importance of lifestyle modification including heart healthy diet, regular physical activity , and smoking cessation.   Disposition:   FU with undersigned in 2months   Signed, Almond LintAileen Karielle Davidow, MD  11/30/2015 3:32 PM    [facility]

## 2015-11-30 NOTE — Patient Instructions (Signed)
Follow-Up: Your physician recommends that you schedule a follow-up appointment in: 2 months with Dr. Ingal.  It was a pleasure seeing you today here in the office. Please do not hesitate to give us a call back if you have any further questions. [phone]  Amberli Ruegg A. RN, BSN     

## 2015-12-21 ENCOUNTER — Encounter: Payer: Self-pay | Admitting: Family Medicine

## 2015-12-21 ENCOUNTER — Other Ambulatory Visit: Payer: Self-pay | Admitting: Family Medicine

## 2015-12-21 ENCOUNTER — Ambulatory Visit (INDEPENDENT_AMBULATORY_CARE_PROVIDER_SITE_OTHER): Payer: BLUE CROSS/BLUE SHIELD | Admitting: Family Medicine

## 2015-12-21 VITALS — BP 137/86 | HR 80 | Temp 98.8°F | Wt 246.6 lb

## 2015-12-21 DIAGNOSIS — E785 Hyperlipidemia, unspecified: Secondary | ICD-10-CM | POA: Diagnosis not present

## 2015-12-21 DIAGNOSIS — F419 Anxiety disorder, unspecified: Secondary | ICD-10-CM

## 2015-12-21 DIAGNOSIS — I10 Essential (primary) hypertension: Secondary | ICD-10-CM

## 2015-12-21 DIAGNOSIS — R635 Abnormal weight gain: Secondary | ICD-10-CM | POA: Diagnosis not present

## 2015-12-21 DIAGNOSIS — G4700 Insomnia, unspecified: Secondary | ICD-10-CM | POA: Diagnosis not present

## 2015-12-21 LAB — LP+ALT+AST PICCOLO, WAIVED
ALT (SGPT) Piccolo, Waived: 28 U/L (ref 10–47)
AST (SGOT) PICCOLO, WAIVED: 26 U/L (ref 11–38)
CHOL/HDL RATIO PICCOLO,WAIVE: 2.8 mg/dL
CHOLESTEROL PICCOLO, WAIVED: 150 mg/dL (ref <200)
HDL Chol Piccolo, Waived: 53 mg/dL — ABNORMAL LOW (ref >59)
LDL Chol Calc Piccolo Waived: 73 mg/dL (ref <100)
Triglycerides Piccolo,Waived: 120 mg/dL (ref <150)
VLDL Chol Calc Piccolo,Waive: 24 mg/dL (ref <30)

## 2015-12-21 MED ORDER — HYDROXYZINE HCL 25 MG PO TABS: 25.0000 mg | ORAL_TABLET | Freq: Every evening | ORAL | 7 refills | Status: AC | PRN

## 2015-12-21 MED ORDER — ROSUVASTATIN CALCIUM 10 MG PO TABS: ORAL_TABLET | ORAL | 6 refills | Status: DC

## 2015-12-21 MED ORDER — METOPROLOL SUCCINATE ER 25 MG PO TB24: 37.5000 mg | ORAL_TABLET | Freq: Every day | ORAL | 6 refills | Status: DC

## 2015-12-21 MED ORDER — ESCITALOPRAM OXALATE 10 MG PO TABS: 10.0000 mg | ORAL_TABLET | Freq: Every day | ORAL | 3 refills | Status: DC

## 2015-12-21 MED ORDER — QUINAPRIL HCL 40 MG PO TABS: 40.0000 mg | ORAL_TABLET | Freq: Every day | ORAL | 6 refills | Status: AC

## 2015-12-21 NOTE — Patient Instructions (Addendum)
Menopause and Herbal Products WHAT IS MENOPAUSE? Menopause is the normal time of life when menstrual periods decrease in frequency and eventually stop completely. This process can take several years for some women. Menopause is complete when you have had an absence of menstruation for a full year since your last menstrual period. It usually occurs between the ages of 4448 and 5955. It is not common for menopause to begin before the age of 48. During menopause, your body stops producing the female hormones estrogen and progesterone. Common symptoms associated with this loss of hormones (vasomotor symptoms) are:  Hot flashes.  Hot flushes.  Night sweats. Other common symptoms and complications of menopause include:  Decrease in sex drive.  Vaginal dryness and thinning of the walls of the vagina. This can make sex painful.  Dryness of the skin and development of wrinkles.  Headaches.  Tiredness.  Irritability.  Memory problems.  Weight gain.  Bladder infections.  Hair growth on the face and chest.  Inability to reproduce offspring (infertility).  Loss of density in the bones (osteoporosis) increasing your risk for breaks (fractures).  Depression.  Hardening and narrowing of the arteries (atherosclerosis). This increases your risk of heart attack and stroke. WHAT TREATMENT OPTIONS ARE AVAILABLE? There are many treatment choices for menopause symptoms. The most common treatment is hormone replacement therapy. Many alternative therapies for menopause are emerging, including the use of herbal products. These supplements can be found in the form of herbs, teas, oils, tinctures, and pills. Common herbal supplements for menopause are made from plants that contain phytoestrogens. Phytoestrogens are compounds that occur naturally in plants and plant products. They act like estrogen in the body. Foods and herbs that contain phytoestrogens include:  Soy.  Flax seeds.  Red  clover.  Ginseng. WHAT MENOPAUSE SYMPTOMS MAY BE HELPED IF I USE HERBAL PRODUCTS?  Vasomotor symptoms. These may be helped by:  Soy. Some studies show that soy may have a moderate benefit for hot flashes.  Black cohosh. There is limited evidence indicating this may be beneficial for hot flashes.  Evening Primrose Oil  Symptoms that are related to heart and blood vessel disease. These may be helped by soy. Studies have shown that soy can help to lower cholesterol.  Depression. This may be helped by:  St. John's wort. There is limited evidence that shows this may help mild to moderate depression.  Black cohosh. There is evidence that this may help depression and mood swings.  Osteoporosis. Soy may help to decrease bone loss that is associated with menopause and may prevent osteoporosis. Limited evidence indicates that red clover may offer some bone loss protection as well. Other herbal products that are commonly used during menopause lack enough evidence to support their use as a replacement for conventional menopause therapies. These products include evening primrose, ginseng, and red clover. WHAT ARE THE CASES WHEN HERBAL PRODUCTS SHOULD NOT BE USED DURING MENOPAUSE? Do not use herbal products during menopause without your health care provider's approval if:  You are taking medicine.  You have a preexisting liver condition. ARE THERE ANY RISKS IN MY TAKING HERBAL PRODUCTS DURING MENOPAUSE? If you choose to use herbal products to help with symptoms of menopause, keep in mind that:  Different supplements have different and unmeasured amounts of herbal ingredients.  Herbal products are not regulated the same way that medicines are.  Concentrations of herbs may vary depending on the way they are prepared. For example, the concentration may be different in a pill,  tea, oil, and tincture.  Little is known about the risks of using herbal products, particularly the risks of long-term  use.  Some herbal supplements can be harmful when combined with certain medicines. Most commonly reported side effects of herbal products are mild. However, if used improperly, many herbal supplements can cause serious problems. Talk to your health care provider before starting any herbal product. If problems develop, stop taking the supplement and let your health care provider know.   This information is not intended to replace advice given to you by your health care provider. Make sure you discuss any questions you have with your health care provider.   Document Released: 07/19/2007 Document Revised: 02/20/2014 Document Reviewed: 07/15/2013 Elsevier Interactive Patient Education [employer]2016 [employer]

## 2015-12-21 NOTE — Telephone Encounter (Signed)
Routing to provider  

## 2015-12-21 NOTE — Progress Notes (Signed)
BP 137/86 (BP Location: Left Arm, Patient Position: Sitting, Cuff Size: Large)   Pulse 80   Temp 98.8 F (37.1 C)   Wt 246 lb 9.6 oz (111.9 kg)   SpO2 98%   BMI 37.50 kg/m    Subjective:    Patient ID: Brittany Zavala, female    DOB: [birth year], 50 y.o.   MRN: [id number]  HPI: Brittany Zavala is a 50 y.o. female  Chief Complaint  Patient presents with  . Hyperlipidemia  . Insomnia  . Weight Gain  . Anxiety    Patient needs a refill on hydroxyzine   HYPERTENSION / HYPERLIPIDEMIA Satisfied with current treatment? yes Duration of hypertension: chronic BP monitoring frequency: not checking BP medication side effects: no Duration of hyperlipidemia: chronic Cholesterol medication side effects: no Cholesterol supplements: none Medication compliance: excellent compliance Aspirin: yes Recent stressors: yes Recurrent headaches: no Visual changes: no Palpitations: no Dyspnea: no Chest pain: no Lower extremity edema: no Dizzy/lightheaded: no  INSOMNIA Duration: month to month and a half Satisfied with sleep quality: no Difficulty falling asleep: yes Difficulty staying asleep: yes Waking a few hours after sleep onset: yes Early morning awakenings: yes Daytime hypersomnolence: no Wakes feeling refreshed: no Good sleep hygiene: no Apnea: no Snoring: yes Depressed/anxious mood: yes Recent stress: yes Restless legs/nocturnal leg cramps: no Chronic pain/arthritis: no History of sleep study: no Treatments attempted: melatonin and benadryl   ANXIETY/STRESS Duration:exacerbated Anxious mood: yes  Excessive worrying: yes Irritability: yes  Sweating: yes Nausea: yes Palpitations:yes Hyperventilation: no Panic attacks: no Agoraphobia: no  Obscessions/compulsions: no Depressed mood: yes [facility] 09/16/2015 03/25/2015  Decreased Interest 0 2  Down, Depressed, Hopeless 1 1  PHQ - 2 Score 1 3  Altered sleeping 1 2  Tired,  decreased energy 1 2  Change in appetite 1 1  Feeling bad or failure about yourself  1 1  Trouble concentrating 0 2  Moving slowly or fidgety/restless 0 1  Suicidal thoughts 0 0  PHQ-9 Score 5 12  Difficult doing work/chores - Somewhat difficult   Anhedonia: no Weight changes: yes Insomnia: yes   Hypersomnia: no Fatigue/loss of energy: yes Feelings of worthlessness: yes Feelings of guilt: yes Impaired concentration/indecisiveness: yes Suicidal ideations: no  Crying spells: yes Recent Stressors/Life Changes: yes   Relationship problems: no   Family stress: yes     Financial stress: no    Job stress: no    Recent death/loss: no  WEIGHT GAIN Duration: couple of months Previous attempts at weight loss: no Complications of obesity: yes  Peak weight: 246 Weight loss goal: 220/180 Weight loss to date: none Requesting obesity pharmacotherapy: no Current weight loss supplements/medications: no Previous weight loss supplements/meds: no  Relevant past medical, surgical, family and social history reviewed and updated as indicated. Interim medical history since our last visit reviewed. Allergies and medications reviewed and updated.  Review of Systems  Constitutional: Negative.   Respiratory: Negative.   Cardiovascular: Negative.   Psychiatric/Behavioral: Positive for agitation, decreased concentration, dysphoric mood and sleep disturbance. Negative for behavioral problems, confusion, hallucinations, self-injury and suicidal ideas. The patient is nervous/anxious. The patient is not hyperactive.     Per HPI unless specifically indicated above     Objective:    BP 137/86 (BP Location: Left Arm, Patient Position: Sitting, Cuff Size: Large)   Pulse 80   Temp 98.8 F (37.1 C)   Wt 246 lb 9.6 oz (111.9 kg)   SpO2 98%   BMI 37.50  kg/m   Wt Readings from Last 3 Encounters:  12/21/15 246 lb 9.6 oz (111.9 kg)  11/30/15 245 lb 12.8 oz (111.5 kg)  10/23/15 235 lb (106.6 kg)      Physical Exam  Constitutional: She is oriented to person, place, and time. She appears well-developed and well-nourished. No distress.  HENT:  Head: Normocephalic and atraumatic.  Right Ear: Hearing normal.  Left Ear: Hearing normal.  Nose: Nose normal.  Eyes: Conjunctivae and lids are normal. Right eye exhibits no discharge. Left eye exhibits no discharge. No scleral icterus.  Cardiovascular: Normal rate, regular rhythm, normal heart sounds and intact distal pulses.  Exam reveals no gallop and no friction rub.   No murmur heard. Pulmonary/Chest: Effort normal and breath sounds normal. No respiratory distress. She has no wheezes. She has no rales. She exhibits no tenderness.  Musculoskeletal: Normal range of motion.  Neurological: She is alert and oriented to person, place, and time.  Skin: Skin is warm, dry and intact. No rash noted. No erythema. No pallor.  Psychiatric: She has a normal mood and affect. Her speech is normal and behavior is normal. Judgment and thought content normal. Cognition and memory are normal.  Nursing note and vitals reviewed.   Results for orders placed or performed in visit on 12/21/15  LP+ALT+AST Piccolo, Arrow ElectronicsWaived  Result Value Ref Range   ALT (SGPT) Piccolo, Waived 28 10 - 47 U/L   AST (SGOT) Piccolo, Waived 26 11 - 38 U/L   Cholesterol Piccolo, Waived 150 <200 mg/dL   HDL Chol Piccolo, Waived 53 (L) >59 mg/dL   Triglycerides Piccolo,Waived 120 <150 mg/dL   Chol/HDL Ratio Piccolo,Waive 2.8 mg/dL   LDL Chol Calc Piccolo Waived 73 <100 mg/dL   VLDL Chol Calc Piccolo,Waive 24 <30 mg/dL  Hemoglobin [id number]  Result Value Ref Range   Hemoglobin A1C 6.7       Assessment & Plan:   Problem List Items Addressed This Visit      Cardiovascular and Mediastinum   HTN (hypertension)    Under good control continue current regimen. Continue to monitor. Call with any concerns.       Relevant Medications   rosuvastatin (CRESTOR) 10 MG tablet   quinapril (ACCUPRIL)  40 MG tablet   metoprolol succinate (TOPROL-XL) 25 MG 24 hr tablet     Other   Insomnia   Anxiety    Exacerbated. Possibly due to perimenopause. Will increase lexapro and refill her hydroxyzine. Recheck 1 month.       Relevant Medications   escitalopram (LEXAPRO) 10 MG tablet   hydrOXYzine (ATARAX/VISTARIL) 25 MG tablet   HLD (hyperlipidemia) - Primary    Under good control continue current regimen. Continue to monitor. Call with any concerns.       Relevant Medications   rosuvastatin (CRESTOR) 10 MG tablet   quinapril (ACCUPRIL) 40 MG tablet   metoprolol succinate (TOPROL-XL) 25 MG 24 hr tablet   Other Relevant Orders   LP+ALT+AST Piccolo, Waived (Completed)    Other Visit Diagnoses    Weight gain       Will start exercising more regularly and watch diet. Recheck 1 month.        Follow up plan: Return in about 4 weeks (around 01/18/2016) for Follow up perimenopause and sleep and weight.

## 2015-12-22 NOTE — Assessment & Plan Note (Signed)
Under good control continue current regimen. Continue to monitor. Call with any concerns.  

## 2015-12-22 NOTE — Assessment & Plan Note (Signed)
Exacerbated. Possibly due to perimenopause. Will increase lexapro and refill her hydroxyzine. Recheck 1 month.

## 2015-12-22 NOTE — Assessment & Plan Note (Signed)
Under good control continue current regimen. Continue to monitor. Call with any concerns.

## 2015-12-31 ENCOUNTER — Other Ambulatory Visit: Payer: Self-pay | Admitting: Family Medicine

## 2016-02-21 LAB — HM DIABETES EYE EXAM

## 2016-03-09 ENCOUNTER — Ambulatory Visit: Payer: BLUE CROSS/BLUE SHIELD | Admitting: Cardiology

## 2016-03-13 ENCOUNTER — Telehealth: Payer: Self-pay | Admitting: Family Medicine

## 2016-03-13 DIAGNOSIS — R918 Other nonspecific abnormal finding of lung field: Secondary | ICD-10-CM

## 2016-03-13 NOTE — Telephone Encounter (Signed)
-----   Message from Dorcas CarrowMegan P Dolly Harbach, DO sent at 04/12/2015  8:38 AM EST ----- Needs follow up CT on nodules in 1 month.

## 2016-03-14 ENCOUNTER — Ambulatory Visit: Payer: BLUE CROSS/BLUE SHIELD | Admitting: Cardiology

## 2016-03-23 ENCOUNTER — Ambulatory Visit
Admission: RE | Admit: 2016-03-23 | Discharge: 2016-03-23 | Disposition: A | Discharge status: Home / Self Care | Payer: BLUE CROSS/BLUE SHIELD | Source: Ambulatory Visit | Attending: Family Medicine | Admitting: Family Medicine

## 2016-03-23 DIAGNOSIS — I2510 Atherosclerotic heart disease of native coronary artery without angina pectoris: Secondary | ICD-10-CM | POA: Insufficient documentation

## 2016-03-23 DIAGNOSIS — R918 Other nonspecific abnormal finding of lung field: Secondary | ICD-10-CM | POA: Diagnosis present

## 2016-03-24 ENCOUNTER — Telehealth: Payer: Self-pay | Admitting: Family Medicine

## 2016-03-24 DIAGNOSIS — K449 Diaphragmatic hernia without obstruction or gangrene: Secondary | ICD-10-CM | POA: Insufficient documentation

## 2016-03-24 NOTE — Telephone Encounter (Signed)
Called to let her know that her CT came back stable. Nothing has changed and everything looks good. No need for more CTs. LMOM with results. She will call with any questions.

## 2016-05-01 ENCOUNTER — Other Ambulatory Visit: Payer: Self-pay | Admitting: Family Medicine

## 2016-05-01 NOTE — Telephone Encounter (Signed)
Routing to provider  

## 2016-05-02 LAB — MICROALBUMIN, URINE

## 2016-05-02 LAB — BASIC METABOLIC PANEL
BUN: 10 mg/dL (ref 4–21)
CREATININE: 0.7 mg/dL (ref 0.5–1.1)
Glucose: 207 mg/dL
POTASSIUM: 4.4 mmol/L (ref 3.4–5.3)
Sodium: 136 mmol/L — AB (ref 137–147)

## 2016-05-02 LAB — TSH: TSH: 0.7 u[IU]/mL (ref 0.41–5.90)

## 2016-05-02 LAB — LIPID PANEL
CHOLESTEROL: 161 mg/dL (ref 0–200)
HDL: 51 mg/dL (ref 35–70)
LDL Cholesterol: 84 mg/dL
Triglycerides: 128 mg/dL (ref 40–160)

## 2016-05-02 LAB — HEMOGLOBIN A1C: HEMOGLOBIN A1C: 7.3

## 2016-05-13 ENCOUNTER — Other Ambulatory Visit: Payer: Self-pay | Admitting: Cardiology

## 2016-05-26 ENCOUNTER — Ambulatory Visit (INDEPENDENT_AMBULATORY_CARE_PROVIDER_SITE_OTHER): Payer: BLUE CROSS/BLUE SHIELD | Admitting: Family Medicine

## 2016-05-26 ENCOUNTER — Ambulatory Visit
Admission: RE | Admit: 2016-05-26 | Discharge: 2016-05-26 | Disposition: A | Discharge status: Home / Self Care | Payer: BLUE CROSS/BLUE SHIELD | Source: Ambulatory Visit | Attending: Family Medicine | Admitting: Family Medicine

## 2016-05-26 ENCOUNTER — Encounter: Payer: Self-pay | Admitting: Family Medicine

## 2016-05-26 VITALS — BP 143/82 | HR 81 | Temp 98.8°F | Ht 63.4 in | Wt 252.1 lb

## 2016-05-26 DIAGNOSIS — F419 Anxiety disorder, unspecified: Secondary | ICD-10-CM | POA: Diagnosis not present

## 2016-05-26 DIAGNOSIS — Z0000 Encounter for general adult medical examination without abnormal findings: Secondary | ICD-10-CM

## 2016-05-26 DIAGNOSIS — I10 Essential (primary) hypertension: Secondary | ICD-10-CM | POA: Diagnosis not present

## 2016-05-26 DIAGNOSIS — E785 Hyperlipidemia, unspecified: Secondary | ICD-10-CM | POA: Diagnosis not present

## 2016-05-26 DIAGNOSIS — E108 Type 1 diabetes mellitus with unspecified complications: Secondary | ICD-10-CM

## 2016-05-26 DIAGNOSIS — K219 Gastro-esophageal reflux disease without esophagitis: Secondary | ICD-10-CM | POA: Diagnosis not present

## 2016-05-26 DIAGNOSIS — Z720 Tobacco use: Secondary | ICD-10-CM

## 2016-05-26 DIAGNOSIS — M25442 Effusion, left hand: Secondary | ICD-10-CM

## 2016-05-26 DIAGNOSIS — I25119 Atherosclerotic heart disease of native coronary artery with unspecified angina pectoris: Secondary | ICD-10-CM

## 2016-05-26 MED ORDER — OMEPRAZOLE 40 MG PO CPDR: 40.0000 mg | DELAYED_RELEASE_CAPSULE | Freq: Every day | ORAL | 1 refills | Status: DC

## 2016-05-26 NOTE — Assessment & Plan Note (Signed)
Slightly elevated. Continue diet and exercise. Recheck at follow up.

## 2016-05-26 NOTE — Assessment & Plan Note (Signed)
No chest pain. Will keep BP and cholesterol under good control. Continue to monitor.

## 2016-05-26 NOTE — Assessment & Plan Note (Signed)
Will stop her lexapro, 1/2 tab for 3-4 days, then stop. Call if anxiety starts up again.

## 2016-05-26 NOTE — Assessment & Plan Note (Signed)
Continue to follow with endocrinology. Call with any concerns.  

## 2016-05-26 NOTE — Assessment & Plan Note (Signed)
Increase omeprazole to  for 1 month. Call if not getting better.

## 2016-05-26 NOTE — Patient Instructions (Addendum)
Health Maintenance, Female Adopting a healthy lifestyle and getting preventive care can go a long way to promote health and wellness. Talk with your health care provider about what schedule of regular examinations is right for you. This is a good chance for you to check in with your provider about disease prevention and staying healthy. In between checkups, there are plenty of things you can do on your own. Experts have done a lot of research about which lifestyle changes and preventive measures are most likely to keep you healthy. Ask your health care provider for more information. Weight and diet Eat a healthy diet  Be sure to include plenty of vegetables, fruits, low-fat dairy products, and lean protein.  Do not eat a lot of foods high in solid fats, added sugars, or salt.  Get regular exercise. This is one of the most important things you can do for your health.  Most adults should exercise for at least 150 minutes each week. The exercise should increase your heart rate and make you sweat (moderate-intensity exercise).  Most adults should also do strengthening exercises at least twice a week. This is in addition to the moderate-intensity exercise. Maintain a healthy weight  Body mass index (BMI) is a measurement that can be used to identify possible weight problems. It estimates body fat based on height and weight. Your health care provider can help determine your BMI and help you achieve or maintain a healthy weight.  For females 76 years of age and older:  A BMI below 18.5 is considered underweight.  A BMI of 18.5 to 24.9 is normal.  A BMI of 25 to 29.9 is considered overweight.  A BMI of 30 and above is considered obese. Watch levels of cholesterol and blood lipids  You should start having your blood tested for lipids and cholesterol at 49 years of age, then have this test every 5 years.  You may need to have your cholesterol levels checked more often if:  Your lipid or  cholesterol levels are high.  You are older than 49 years of age.  You are at high risk for heart disease. Cancer screening Lung Cancer  Lung cancer screening is recommended for adults 64-42 years old who are at high risk for lung cancer because of a history of smoking.  A yearly low-dose CT scan of the lungs is recommended for people who:  Currently smoke.  Have quit within the past 15 years.  Have at least a 30-pack-year history of smoking. A pack year is smoking an average of one pack of cigarettes a day for 1 year.  Yearly screening should continue until it has been 15 years since you quit.  Yearly screening should stop if you develop a health problem that would prevent you from having lung cancer treatment. Breast Cancer  Practice breast self-awareness. This means understanding how your breasts normally appear and feel.  It also means doing regular breast self-exams. Let your health care provider know about any changes, no matter how small.  If you are in your 20s or 30s, you should have a clinical breast exam (CBE) by a health care provider every 1-3 years as part of a regular health exam.  If you are 34 or older, have a CBE every year. Also consider having a breast X-ray (mammogram) every year.  If you have a family history of breast cancer, talk to your health care provider about genetic screening.  If you are at high risk for breast cancer, talk  to your health care provider about having an MRI and a mammogram every year.  Breast cancer gene (BRCA) assessment is recommended for women who have family members with BRCA-related cancers. BRCA-related cancers include:  Breast.  Ovarian.  Tubal.  Peritoneal cancers.  Results of the assessment will determine the need for genetic counseling and BRCA1 and BRCA2 testing. Cervical Cancer  Your health care provider may recommend that you be screened regularly for cancer of the pelvic organs (ovaries, uterus, and vagina).  This screening involves a pelvic examination, including checking for microscopic changes to the surface of your cervix (Pap test). You may be encouraged to have this screening done every 3 years, beginning at age 24.  For women ages 66-65, health care providers may recommend pelvic exams and Pap testing every 3 years, or they may recommend the Pap and pelvic exam, combined with testing for human papilloma virus (HPV), every 5 years. Some types of HPV increase your risk of cervical cancer. Testing for HPV may also be done on women of any age with unclear Pap test results.  Other health care providers may not recommend any screening for nonpregnant women who are considered low risk for pelvic cancer and who do not have symptoms. Ask your health care provider if a screening pelvic exam is right for you.  If you have had past treatment for cervical cancer or a condition that could lead to cancer, you need Pap tests and screening for cancer for at least 20 years after your treatment. If Pap tests have been discontinued, your risk factors (such as having a new sexual partner) need to be reassessed to determine if screening should resume. Some women have medical problems that increase the chance of getting cervical cancer. In these cases, your health care provider may recommend more frequent screening and Pap tests. Colorectal Cancer  This type of cancer can be detected and often prevented.  Routine colorectal cancer screening usually begins at 49 years of age and continues through 49 years of age.  Your health care provider may recommend screening at an earlier age if you have risk factors for colon cancer.  Your health care provider may also recommend using home test kits to check for hidden blood in the stool.  A small camera at the end of a tube can be used to examine your colon directly (sigmoidoscopy or colonoscopy). This is done to check for the earliest forms of colorectal cancer.  Routine  screening usually begins at age 41.  Direct examination of the colon should be repeated every 5-10 years through 49 years of age. However, you may need to be screened more often if early forms of precancerous polyps or small growths are found. Skin Cancer  Check your skin from head to toe regularly.  Tell your health care provider about any new moles or changes in moles, especially if there is a change in a mole's shape or color.  Also tell your health care provider if you have a mole that is larger than the size of a pencil eraser.  Always use sunscreen. Apply sunscreen liberally and repeatedly throughout the day.  Protect yourself by wearing long sleeves, pants, a wide-brimmed hat, and sunglasses whenever you are outside. Heart disease, diabetes, and high blood pressure  High blood pressure causes heart disease and increases the risk of stroke. High blood pressure is more likely to develop in:  People who have blood pressure in the high end of the normal range (130-139/85-89 mm Hg).  People who are overweight or obese.  People who are African American.  If you are 59-24 years of age, have your blood pressure checked every 3-5 years. If you are 34 years of age or older, have your blood pressure checked every year. You should have your blood pressure measured twice-once when you are at a hospital or clinic, and once when you are not at a hospital or clinic. Record the average of the two measurements. To check your blood pressure when you are not at a hospital or clinic, you can use:  An automated blood pressure machine at a pharmacy.  A home blood pressure monitor.  If you are between 29 years and 60 years old, ask your health care provider if you should take aspirin to prevent strokes.  Have regular diabetes screenings. This involves taking a blood sample to check your fasting blood sugar level.  If you are at a normal weight and have a low risk for diabetes, have this test once  every three years after 49 years of age.  If you are overweight and have a high risk for diabetes, consider being tested at a younger age or more often. Preventing infection Hepatitis B  If you have a higher risk for hepatitis B, you should be screened for this virus. You are considered at high risk for hepatitis B if:  You were born in a country where hepatitis B is common. Ask your health care provider which countries are considered high risk.  Your parents were born in a high-risk country, and you have not been immunized against hepatitis B (hepatitis B vaccine).  You have HIV or AIDS.  You use needles to inject street drugs.  You live with someone who has hepatitis B.  You have had sex with someone who has hepatitis B.  You get hemodialysis treatment.  You take certain medicines for conditions, including cancer, organ transplantation, and autoimmune conditions. Hepatitis C  Blood testing is recommended for:  Everyone born from [birth year] through 1963.  Anyone with known risk factors for hepatitis C. Sexually transmitted infections (STIs)  You should be screened for sexually transmitted infections (STIs) including gonorrhea and chlamydia if:  You are sexually active and are younger than 49 years of age.  You are older than 49 years of age and your health care provider tells you that you are at risk for this type of infection.  Your sexual activity has changed since you were last screened and you are at an increased risk for chlamydia or gonorrhea. Ask your health care provider if you are at risk.  If you do not have HIV, but are at risk, it may be recommended that you take a prescription medicine daily to prevent HIV infection. This is called pre-exposure prophylaxis (PrEP). You are considered at risk if:  You are sexually active and do not regularly use condoms or know the HIV status of your partner(s).  You take drugs by injection.  You are sexually active with a partner  who has HIV. Talk with your health care provider about whether you are at high risk of being infected with HIV. If you choose to begin PrEP, you should first be tested for HIV. You should then be tested every 3 months for as long as you are taking PrEP. Pregnancy  If you are premenopausal and you may become pregnant, ask your health care provider about preconception counseling.  If you may become pregnant, take 400 to 800 micrograms (mcg) of folic acid  every day.  If you want to prevent pregnancy, talk to your health care provider about birth control (contraception). Osteoporosis and menopause  Osteoporosis is a disease in which the bones lose minerals and strength with aging. This can result in serious bone fractures. Your risk for osteoporosis can be identified using a bone density scan.  If you are 29 years of age or older, or if you are at risk for osteoporosis and fractures, ask your health care provider if you should be screened.  Ask your health care provider whether you should take a calcium or vitamin D supplement to lower your risk for osteoporosis.  Menopause may have certain physical symptoms and risks.  Hormone replacement therapy may reduce some of these symptoms and risks. Talk to your health care provider about whether hormone replacement therapy is right for you. Follow these instructions at home:  Schedule regular health, dental, and eye exams.  Stay current with your immunizations.  Do not use any tobacco products including cigarettes, chewing tobacco, or electronic cigarettes.  If you are pregnant, do not drink alcohol.  If you are breastfeeding, limit how much and how often you drink alcohol.  Limit alcohol intake to no more than 1 drink per day for nonpregnant women. One drink equals 12 ounces of beer, 5 ounces of wine, or 1 ounces of hard liquor.  Do not use street drugs.  Do not share needles.  Ask your health care provider for help if you need support  or information about quitting drugs.  Tell your health care provider if you often feel depressed.  Tell your health care provider if you have ever been abused or do not feel safe at home. This information is not intended to replace advice given to you by your health care provider. Make sure you discuss any questions you have with your health care provider. Document Released: 08/15/2010 Document Revised: 07/08/2015 Document Reviewed: 11/03/2014 Elsevier Interactive Patient Education  2017 [place] is the time when your body begins to move into the menopause (no menstrual period for 12 straight months). It is a natural process. Perimenopause can begin 2-8 years before the menopause and usually lasts for 1 year after the menopause. During this time, your ovaries may or may not produce an egg. The ovaries vary in their production of estrogen and progesterone hormones each month. This can cause irregular menstrual periods, difficulty getting pregnant, vaginal bleeding between periods, and uncomfortable symptoms. What are the causes?  Irregular production of the ovarian hormones, estrogen and progesterone, and not ovulating every month. Other causes include:  Tumor of the pituitary gland in the brain.  Medical disease that affects the ovaries.  Radiation treatment.  Chemotherapy.  Unknown causes.  Heavy smoking and excessive alcohol intake can bring on perimenopause sooner. What are the signs or symptoms?  Hot flashes.  Night sweats.  Irregular menstrual periods.  Decreased sex drive.  Vaginal dryness.  Headaches.  Mood swings.  Depression.  Memory problems.  Irritability.  Tiredness.  Weight gain.  Trouble getting pregnant.  The beginning of losing bone cells (osteoporosis).  The beginning of hardening of the arteries (atherosclerosis). How is this diagnosed? Your health care provider will make a diagnosis by analyzing your age,  menstrual history, and symptoms. He or she will do a physical exam and note any changes in your body, especially your female organs. Female hormone tests may or may not be helpful depending on the amount of female hormones you produce and when  you produce them. However, other hormone tests may be helpful to rule out other problems. How is this treated? In some cases, no treatment is needed. The decision on whether treatment is necessary during the perimenopause should be made by you and your health care provider based on how the symptoms are affecting you and your lifestyle. Various treatments are available, such as:  Treating individual symptoms with a specific medicine for that symptom.  Herbal medicines that can help specific symptoms.  Counseling.  Group therapy. Follow these instructions at home:  Keep track of your menstrual periods (when they occur, how heavy they are, how long between periods, and how long they last) as well as your symptoms and when they started.  Only take over-the-counter or prescription medicines as directed by your health care provider.  Sleep and rest.  Exercise.  Eat a diet that contains calcium (good for your bones) and soy (acts like the estrogen hormone).  Do not smoke.  Avoid alcoholic beverages.  Take vitamin supplements as recommended by your health care provider. Taking vitamin E may help in certain cases.  Take calcium and vitamin D supplements to help prevent bone loss.  Group therapy is sometimes helpful.  Acupuncture may help in some cases. Contact a health care provider if:  You have questions about any symptoms you are having.  You need a referral to a specialist (gynecologist, psychiatrist, or psychologist). Get help right away if:  You have vaginal bleeding.  Your period lasts longer than 8 days.  Your periods are recurring sooner than 21 days.  You have bleeding after intercourse.  You have severe depression.  You have  pain when you urinate.  You have severe headaches.  You have vision problems. This information is not intended to replace advice given to you by your health care provider. Make sure you discuss any questions you have with your health care provider. Document Released: 03/09/2004 Document Revised: 07/08/2015 Document Reviewed: 08/29/2012 Elsevier Interactive Patient Education  2017 [place]. Menopause and Herbal Products What is menopause? Menopause is the normal time of life when menstrual periods decrease in frequency and eventually stop completely. This process can take several years for some women. Menopause is complete when you have had an absence of menstruation for a full year since your last menstrual period. It usually occurs between the ages of 38 and 65. It is not common for menopause to begin before the age of 25. During menopause, your body stops producing the female hormones estrogen and progesterone. Common symptoms associated with this loss of hormones (vasomotor symptoms) are:  Hot flashes.  Hot flushes.  Night sweats. Other common symptoms and complications of menopause include:  Decrease in sex drive.  Vaginal dryness and thinning of the walls of the vagina. This can make sex painful.  Dryness of the skin and development of wrinkles.  Headaches.  Tiredness.  Irritability.  Memory problems.  Weight gain.  Bladder infections.  Hair growth on the face and chest.  Inability to reproduce offspring (infertility).  Loss of density in the bones (osteoporosis) increasing your risk for breaks (fractures).  Depression.  Hardening and narrowing of the arteries (atherosclerosis). This increases your risk of heart attack and stroke. What treatment options are available? There are many treatment choices for menopause symptoms. The most common treatment is hormone replacement therapy. Many alternative therapies for menopause are emerging, including the use of  herbal products. These supplements can be found in the form of herbs, teas, oils, tinctures,  and pills. Common herbal supplements for menopause are made from plants that contain phytoestrogens. Phytoestrogens are compounds that occur naturally in plants and plant products. They act like estrogen in the body. Foods and herbs that contain phytoestrogens include:  Soy.  Flax seeds.  Red clover.  Ginseng. What menopause symptoms may be helped if I use herbal products?  Vasomotor symptoms. These may be helped by:  Soy. Some studies show that soy may have a moderate benefit for hot flashes.  Black cohosh. There is limited evidence indicating this may be beneficial for hot flashes.  Symptoms that are related to heart and blood vessel disease. These may be helped by soy. Studies have shown that soy can help to lower cholesterol.  Depression. This may be helped by:  St. John's wort. There is limited evidence that shows this may help mild to moderate depression.  Black cohosh. There is evidence that this may help depression and mood swings.  Osteoporosis. Soy may help to decrease bone loss that is associated with menopause and may prevent osteoporosis. Limited evidence indicates that red clover may offer some bone loss protection as well. Other herbal products that are commonly used during menopause lack enough evidence to support their use as a replacement for conventional menopause therapies. These products include evening primrose, ginseng, and red clover. What are the cases when herbal products should not be used during menopause? Do not use herbal products during menopause without your health care provider's approval if:  You are taking medicine.  You have a preexisting liver condition. Are there any risks in my taking herbal products during menopause? If you choose to use herbal products to help with symptoms of menopause, keep in mind that:  Different supplements have different and  unmeasured amounts of herbal ingredients.  Herbal products are not regulated the same way that medicines are.  Concentrations of herbs may vary depending on the way they are prepared. For example, the concentration may be different in a pill, tea, oil, and tincture.  Little is known about the risks of using herbal products, particularly the risks of long-term use.  Some herbal supplements can be harmful when combined with certain medicines. Most commonly reported side effects of herbal products are mild. However, if used improperly, many herbal supplements can cause serious problems. Talk to your health care provider before starting any herbal product. If problems develop, stop taking the supplement and let your health care provider know. This information is not intended to replace advice given to you by your health care provider. Make sure you discuss any questions you have with your health care provider. Document Released: 07/19/2007 Document Revised: 12/28/2015 Document Reviewed: 07/15/2013 Elsevier Interactive Patient Education  2017 [employer].

## 2016-05-26 NOTE — Progress Notes (Signed)
BP (!) 143/82 (BP Location: Left Arm, Patient Position: Sitting, Cuff Size: Large)   Pulse 81   Temp 98.8 F (37.1 C)   Ht 5' 3.4" (1.61 m)   Wt 252 lb 1.6 oz (114.4 kg)   LMP 05/23/2016   SpO2 95%   BMI 44.10 kg/m    Subjective:    Patient ID: Brittany Zavala, female    DOB: 12-29-1965, 51 y.o.   MRN: [id number]  HPI: Brittany Zavala is a 51 y.o. female presenting on 05/26/2016 for comprehensive medical examination. Current medical complaints include:  MENOPAUSAL SYMPTOMS- having hot flashes and mood swings, she is putting her son's testosterone on for him- making sure to wear 2 gloves, but is concerned that this is causing some of it Duration: exacerbated Symptom severity: moderate Hot flashes: yes Night sweats: yes Sleep disturbances: no Vaginal dryness: no Dyspareunia:no Decreased libido: no Emotional lability: yes Stress incontinence: no Previous HRT/pharmacotherapy: no Hysterectomy: no  HYPERTENSION / HYPERLIPIDEMIA Satisfied with current treatment? yes Duration of hypertension: chronic BP monitoring frequency: not checking BP range:  BP medication side effects: no Past BP meds:  Duration of hyperlipidemia: chronic Cholesterol medication side effects: no Cholesterol supplements: none Past cholesterol medications: crestor Medication compliance: excellent compliance Aspirin: no Recent stressors: yes Recurrent headaches: no Visual changes: no Palpitations: no Dyspnea: no Chest pain: no Lower extremity edema: no Dizzy/lightheaded: no  DIABETES Hypoglycemic episodes:yes Polydipsia/polyuria: no Visual disturbance: no Chest pain: no Paresthesias: no Glucose Monitoring: yes- continuous pump Taking Insulin?: yes Blood Pressure Monitoring: not checking Retinal Examination: Up to Date Foot Exam: Up to Date Diabetic Education: Completed Pneumovax: Not up to Date Influenza: Not up to Date Aspirin: no  ANXIETY/STRESS- she thinks  that she is gaining weight because of the lexapro. She's stable. Duration:stable Anxious mood: yes  Excessive worrying: no Irritability: no  Sweating: no Nausea: no Palpitations:no Hyperventilation: no Panic attacks: no Agoraphobia: no  Obscessions/compulsions: no Depressed mood: no Depression screen [facility] 2/9 05/26/2016 09/16/2015 03/25/2015  Decreased Interest 0 0 2  Down, Depressed, Hopeless 0 1 1  PHQ - 2 Score 0 1 3  Altered sleeping - 1 2  Tired, decreased energy - 1 2  Change in appetite - 1 1  Feeling bad or failure about yourself  - 1 1  Trouble concentrating - 0 2  Moving slowly or fidgety/restless - 0 1  Suicidal thoughts - 0 0  PHQ-9 Score - 5 12  Difficult doing work/chores - - Somewhat difficult   Anhedonia: no Weight changes: no Insomnia: no   Hypersomnia: no Fatigue/loss of energy: no Feelings of worthlessness: no Feelings of guilt: no Impaired concentration/indecisiveness: no Suicidal ideations: no  Crying spells: no Recent Stressors/Life Changes: no  She currently lives with: husband and son Menopausal Symptoms: yes  Depression Screen done today and results listed below:  Depression screen [facility] 2/9 05/26/2016 09/16/2015 03/25/2015  Decreased Interest 0 0 2  Down, Depressed, Hopeless 0 1 1  PHQ - 2 Score 0 1 3  Altered sleeping - 1 2  Tired, decreased energy - 1 2  Change in appetite - 1 1  Feeling bad or failure about yourself  - 1 1  Trouble concentrating - 0 2  Moving slowly or fidgety/restless - 0 1  Suicidal thoughts - 0 0  PHQ-9 Score - 5 12  Difficult doing work/chores - - Somewhat difficult     Past Medical History:  Past Medical History:  Diagnosis Date  . ANA positive  Oct 2015   1:160 speckled  . Anxiety   . CAD (coronary artery disease)   . Chronic UTI   . Current use of insulin (HCC)   . Depression   . Furuncle of right axilla   . Gall bladder polyp 3/16   On UA- needs follow up scan 6 months later, (9/16)  . Gallbladder polyp  March 2016   needs f/u scan in Sept 2016  . Hematuria   . Hyperlipidemia   . Hypertension   . Insomnia   . Insomnia   . Tobacco abuse   . Type 1 diabetes mellitus (HCC)     Surgical History:  Past Surgical History:  Procedure Laterality Date  . APPENDECTOMY    . CARDIAC CATHETERIZATION    . CESAREAN SECTION    . CESAREAN SECTION     x2  . NASAL SINUS SURGERY    . TONSILECTOMY/ADENOIDECTOMY WITH MYRINGOTOMY      Medications:  Current Outpatient Prescriptions on File Prior to Visit  Medication Sig  . BD PEN NEEDLE NANO U/F 32G X 4 MM MISC See admin instructions. Use as directed.  . escitalopram (LEXAPRO) 10 MG tablet TAKE 1 TABLET BY MOUTH DAILY  . HUMALOG 100 UNIT/ML injection   . hydrOXYzine (ATARAX/VISTARIL) 25 MG tablet Take 1 tablet (25 mg total) by mouth at bedtime as needed.  . Insulin Human (INSULIN PUMP) SOLN Inject into the skin continuous. Use as directed continuously.  . isosorbide mononitrate (IMDUR) 30 MG 24 hr tablet TAKE 1 TABLET BY MOUTH DAILY  . ketoconazole (NIZORAL) 2 % shampoo APPLY TO AFFECTED AREAS AND LATHER LET SIT FOR 5 MINUTES THEN RINSE REPEAT ONCE A DAY FOR ONE WEEK THEN USE ONLY ONCE A WEEK  . metoprolol succinate (TOPROL-XL) 25 MG 24 hr tablet Take 1.5 tablets (37.5 mg total) by mouth daily.  . nitrofurantoin (MACRODANTIN) 100 MG capsule Take within 30 minutes of having sex. If not getting better call.  . ondansetron (ZOFRAN ODT) 4 MG disintegrating tablet Take 1 tablet (4 mg total) by mouth every 6 (six) hours as needed for nausea or vomiting.  . ONE TOUCH ULTRA TEST test strip 1 each by Other route 4 (four) times daily.   . quinapril (ACCUPRIL) 40 MG tablet Take 1 tablet (40 mg total) by mouth daily.  . rosuvastatin (CRESTOR) 10 MG tablet TAKE ONE TABLET AT BEDTIME FOR CHOLESTEROL  . amLODipine (NORVASC) 5 MG tablet Take 1 tablet (5 mg total) by mouth daily.   No current facility-administered medications on file prior to visit.     Allergies:   Allergies  Allergen Reactions  . Morphine And Related Nausea And Vomiting  . Sulfa Antibiotics Hives  . Tramadol Nausea And Vomiting  . Vicodin [Hydrocodone-Acetaminophen] Nausea And Vomiting  . Septra [Sulfamethoxazole-Trimethoprim] Rash    Social History:  Social History   Social History  . Marital status: Married    Spouse name: N/A  . Number of children: N/A  . Years of education: N/A   Occupational History  . Not on file.   Social History Main Topics  . Smoking status: Current Every Day Smoker    Packs/day: 0.50    Types: Cigarettes  . Smokeless tobacco: Never Used  . Alcohol use No  . Drug use: No  . Sexual activity: Yes   Other Topics Concern  . Not on file   Social History Narrative  . No narrative on file   History  Smoking Status  . Current Every  Day Smoker  . Packs/day: 0.50  . Types: Cigarettes  Smokeless Tobacco  . Never Used   History  Alcohol Use No    Family History:  Family History  Problem Relation Age of Onset  . Cancer Mother     breast  . Stroke Mother   . Dementia Mother   . Addison's disease Mother   . Diabetes Mother   . Heart disease Mother   . Heart disease Father   . Cancer Sister     breast  . Asthma Daughter   . Allergies Daughter     Past medical history, surgical history, medications, allergies, family history and social history reviewed with patient today and changes made to appropriate areas of the chart.   Review of Systems  Constitutional: Positive for diaphoresis. Negative for chills, fever, malaise/fatigue and weight loss.  HENT: Negative.   Eyes: Negative.   Respiratory: Positive for cough. Negative for hemoptysis, sputum production, shortness of breath and wheezing.   Cardiovascular: Negative.   Gastrointestinal: Positive for heartburn. Negative for abdominal pain, blood in stool, constipation, diarrhea, melena, nausea and vomiting.  Genitourinary: Negative.   Musculoskeletal: Positive for joint pain.  Negative for back pain, falls, myalgias and neck pain.  Skin: Negative.   Neurological: Negative.  Negative for weakness.  Endo/Heme/Allergies: Negative for environmental allergies and polydipsia. Bruises/bleeds easily.  Psychiatric/Behavioral: Negative.     All other ROS negative except what is listed above and in the HPI.      Objective:    BP (!) 143/82 (BP Location: Left Arm, Patient Position: Sitting, Cuff Size: Large)   Pulse 81   Temp 98.8 F (37.1 C)   Ht 5' 3.4" (1.61 m)   Wt 252 lb 1.6 oz (114.4 kg)   LMP 05/23/2016   SpO2 95%   BMI 44.10 kg/m   Wt Readings from Last 3 Encounters:  05/26/16 252 lb 1.6 oz (114.4 kg)  12/21/15 246 lb 9.6 oz (111.9 kg)  11/30/15 245 lb 12.8 oz (111.5 kg)    Physical Exam  Constitutional: She is oriented to person, place, and time. She appears well-developed and well-nourished. No distress.  HENT:  Head: Normocephalic and atraumatic.  Right Ear: Hearing, tympanic membrane, external ear and ear canal normal.  Left Ear: Hearing, tympanic membrane, external ear and ear canal normal.  Nose: Nose normal.  Mouth/Throat: Uvula is midline, oropharynx is clear and moist and mucous membranes are normal. No oropharyngeal exudate.  Eyes: Conjunctivae, EOM and lids are normal. Pupils are equal, round, and reactive to light. Right eye exhibits no discharge. Left eye exhibits no discharge. No scleral icterus.  Neck: Normal range of motion. Neck supple. No JVD present. No tracheal deviation present. No thyromegaly present.  Cardiovascular: Normal rate, regular rhythm, normal heart sounds and intact distal pulses.  Exam reveals no gallop and no friction rub.   No murmur heard. Pulmonary/Chest: Effort normal and breath sounds normal. No stridor. No respiratory distress. She has no wheezes. She has no rales. She exhibits no tenderness. Right breast exhibits no inverted nipple, no mass, no nipple discharge, no skin change and no tenderness. Left breast  exhibits no inverted nipple, no mass, no nipple discharge, no skin change and no tenderness. Breasts are symmetrical.  Abdominal: Soft. Bowel sounds are normal. She exhibits no distension and no mass. There is no tenderness. There is no rebound and no guarding.  Musculoskeletal: She exhibits deformity (effusion of PIP of L hand). She exhibits no edema or tenderness.  Lymphadenopathy:    She has no cervical adenopathy.  Neurological: She is alert and oriented to person, place, and time. She has normal reflexes. She displays normal reflexes. No cranial nerve deficit. She exhibits normal muscle tone. Coordination normal.  Skin: Skin is warm, dry and intact. No rash noted. She is not diaphoretic. No erythema. No pallor.  Psychiatric: She has a normal mood and affect. Her speech is normal and behavior is normal. Judgment and thought content normal. Cognition and memory are normal.  Nursing note and vitals reviewed.   Results for orders placed or performed in visit on 05/26/16  Hemoglobin A1c  Result Value Ref Range   Hemoglobin A1C 7.3   Microalbumin, urine  Result Value Ref Range   Microalb, Ur <0.6   Basic metabolic panel  Result Value Ref Range   Glucose 207 mg/dL   BUN 10 4 - 21 mg/dL   Creatinine 0.7 0.5 - 1.1 mg/dL   Potassium 4.4 3.4 - 5.3 mmol/L   Sodium 136 (A) 137 - 147 mmol/L  Lipid panel  Result Value Ref Range   Triglycerides 128 40 - 160 mg/dL   Cholesterol 161 0 - 096 mg/dL   HDL 51 35 - 70 mg/dL   LDL Cholesterol 84 mg/dL  TSH  Result Value Ref Range   TSH 0.70 0.41 - 5.90 uIU/mL  HM DIABETES EYE EXAM  Result Value Ref Range   HM Diabetic Eye Exam No Retinopathy No Retinopathy      Assessment & Plan:   Problem List Items Addressed This Visit      Cardiovascular and Mediastinum   CAD (coronary artery disease)    No chest pain. Will keep BP and cholesterol under good control. Continue to monitor.       HTN (hypertension)    Slightly elevated. Continue diet  and exercise. Recheck at follow up.        Digestive   GERD (gastroesophageal reflux disease)    Increase omeprazole to 40mg  for 1 month. Call if not getting better.       Relevant Medications   omeprazole (PRILOSEC) 40 MG capsule     Endocrine   Type 1 diabetes (HCC)    Continue to follow with endocrinology. Call with any concerns.       Relevant Medications   insulin aspart (NOVOLOG) 100 UNIT/ML injection     Other   Tobacco abuse    Not ready to quit yet. Will let us know when she's ready.      Anxiety    Will stop her lexapro, 1/2 tab for 3-4 days, then stop. Call if anxiety starts up again.       HLD (hyperlipidemia)    Under good control, continue current regimen. Continue to monitor.        Other Visit Diagnoses    Routine general medical examination at a health care facility    -  Primary   Vaccines declined. Screening labs up to date. Pap up to date. Mammogram ordered. Continue diet and exercise.    Swelling of finger joint of left hand       Will obtain x-ray. Await results. Call with any concerns. Continue to follow with rheumatology.   Relevant Orders   DG Hand Complete Left       Follow up plan: Return in about 6 months (around 11/25/2016).   LABORATORY TESTING:  - Pap smear: up to date  IMMUNIZATIONS:   - Tdap: Tetanus vaccination status reviewed: last tetanus  booster within 10 years. - Influenza: Postponed to flu season - Pneumovax: Refused  SCREENING: -Mammogram: Ordered today   PATIENT COUNSELING:   Advised to take 1 mg of folate supplement per day if capable of pregnancy.   Sexuality: Discussed sexually transmitted diseases, partner selection, use of condoms, avoidance of unintended pregnancy  and contraceptive alternatives.   Advised to avoid cigarette smoking.  I discussed with the patient that most people either abstain from alcohol or drink within safe limits (<=14/week and <=4 drinks/occasion for males, <=7/weeks and <= 3  drinks/occasion for females) and that the risk for alcohol disorders and other health effects rises proportionally with the number of drinks per week and how often a drinker exceeds daily limits.  Discussed cessation/primary prevention of drug use and availability of treatment for abuse.   Diet: Encouraged to adjust caloric intake to maintain  or achieve ideal body weight, to reduce intake of dietary saturated fat and total fat, to limit sodium intake by avoiding high sodium foods and not adding table salt, and to maintain adequate dietary potassium and calcium preferably from fresh fruits, vegetables, and low-fat dairy products.    stressed the importance of regular exercise  Injury prevention: Discussed safety belts, safety helmets, smoke detector, smoking near bedding or upholstery.   Dental health: Discussed importance of regular tooth brushing, flossing, and dental visits.    NEXT PREVENTATIVE PHYSICAL DUE IN 1 YEAR. Return in about 6 months (around 11/25/2016).

## 2016-05-26 NOTE — Assessment & Plan Note (Signed)
Not ready to quit yet. Will let us know when she's ready.

## 2016-05-26 NOTE — Assessment & Plan Note (Signed)
Under good control, continue current regimen. Continue to monitor.

## 2016-06-07 ENCOUNTER — Telehealth: Payer: Self-pay | Admitting: Family Medicine

## 2016-06-08 NOTE — Telephone Encounter (Signed)
Patient notified

## 2016-06-08 NOTE — Telephone Encounter (Signed)
Please let her know that we didn't call because we sent her a mychart message. Her x-ray was negative. Thanks!

## 2016-06-23 ENCOUNTER — Encounter: Payer: Self-pay | Admitting: Family Medicine

## 2016-06-23 ENCOUNTER — Ambulatory Visit (INDEPENDENT_AMBULATORY_CARE_PROVIDER_SITE_OTHER): Payer: BLUE CROSS/BLUE SHIELD | Admitting: Family Medicine

## 2016-06-23 VITALS — BP 151/86 | HR 89 | Temp 99.0°F | Wt 252.7 lb

## 2016-06-23 DIAGNOSIS — M2550 Pain in unspecified joint: Secondary | ICD-10-CM

## 2016-06-23 MED ORDER — NAPROXEN 500 MG PO TABS: 500.0000 mg | ORAL_TABLET | Freq: Two times a day (BID) | ORAL | 3 refills | Status: AC

## 2016-06-23 NOTE — Progress Notes (Signed)
BP (!) 151/86 (BP Location: Left Arm, Patient Position: Sitting, Cuff Size: Large)   Pulse 89   Temp 99 F (37.2 C)   Wt 252 lb 11.2 oz (114.6 kg)   SpO2 100%   BMI 44.20 kg/m    Subjective:    Patient ID: Brittany Zavala, female    DOB: 1965/07/02, 51 y.o.   MRN: [id number]  HPI: Brittany Zavala is a 51 y.o. female  Chief Complaint  Patient presents with  . Joint Pain   ARTHRALGIAS / JOINT ACHES Duration: Off and on for the last month- migratory joint pain Pain: yes Symmetric: no Severity: severe Quality: aching, shooting and throbbing Frequency: constant Context:  worse Decreased function/range of motion: yes  Erythema: yes Swelling: yes Heat or warmth: yes Morning stiffness: yes Aggravating factors: nothing Alleviating factors: heat Relief with NSAIDs?: moderate Treatments attempted:  heat and ibuprofen  Involved Joints:     Hands: yes bilateral    Wrists: yes bilateral     Elbows: yes bilateral    Shoulders: yes bilateral    Back: yes     Hips: yes bilateral    Knees: yes bilateral    Ankles: yes bilateral    Feet: yes bilateral  Relevant past medical, surgical, family and social history reviewed and updated as indicated. Interim medical history since our last visit reviewed. Allergies and medications reviewed and updated.  Review of Systems  Constitutional: Negative.   Respiratory: Negative.   Cardiovascular: Negative.   Musculoskeletal: Positive for arthralgias, back pain, joint swelling and myalgias. Negative for gait problem, neck pain and neck stiffness.  Neurological: Positive for numbness. Negative for dizziness, tremors, seizures, syncope, facial asymmetry, speech difficulty, weakness, light-headedness and headaches.  Psychiatric/Behavioral: Negative.     Per HPI unless specifically indicated above     Objective:    BP (!) 151/86 (BP Location: Left Arm, Patient Position: Sitting, Cuff Size: Large)   Pulse 89   Temp  99 F (37.2 C)   Wt 252 lb 11.2 oz (114.6 kg)   SpO2 100%   BMI 44.20 kg/m   Wt Readings from Last 3 Encounters:  06/23/16 252 lb 11.2 oz (114.6 kg)  05/26/16 252 lb 1.6 oz (114.4 kg)  12/21/15 246 lb 9.6 oz (111.9 kg)    Physical Exam  Constitutional: She is oriented to person, place, and time. She appears well-developed and well-nourished. No distress.  HENT:  Head: Normocephalic and atraumatic.  Right Ear: Hearing normal.  Left Ear: Hearing normal.  Nose: Nose normal.  Eyes: Conjunctivae and lids are normal. Right eye exhibits no discharge. Left eye exhibits no discharge. No scleral icterus.  Cardiovascular: Normal rate, regular rhythm, normal heart sounds and intact distal pulses.  Exam reveals no gallop and no friction rub.   No murmur heard. Pulmonary/Chest: Effort normal and breath sounds normal. No respiratory distress. She has no wheezes. She has no rales. She exhibits no tenderness.  Musculoskeletal: Normal range of motion. She exhibits edema and tenderness. She exhibits no deformity.  Erythematous swollen joints in her hands  Neurological: She is alert and oriented to person, place, and time.  Skin: Skin is warm, dry and intact. No rash noted. No erythema. No pallor.  Psychiatric: She has a normal mood and affect. Her speech is normal and behavior is normal. Judgment and thought content normal. Cognition and memory are normal.  Nursing note and vitals reviewed.   Results for orders placed or performed in visit on 05/26/16  Hemoglobin  A1c  Result Value Ref Range   Hemoglobin A1C 7.3   Microalbumin, urine  Result Value Ref Range   Microalb, Ur <0.6   Basic metabolic panel  Result Value Ref Range   Glucose 207 mg/dL   BUN 10 4 - 21 mg/dL   Creatinine 0.7 0.5 - 1.1 mg/dL   Potassium 4.4 3.4 - 5.3 mmol/L   Sodium 136 (A) 137 - 147 mmol/L  Lipid panel  Result Value Ref Range   Triglycerides 128 40 - 160 mg/dL   Cholesterol 161161 0 - 096200 mg/dL   HDL 51 35 - 70 mg/dL     LDL Cholesterol 84 mg/dL  TSH  Result Value Ref Range   TSH 0.70 0.41 - 5.90 uIU/mL  HM DIABETES EYE EXAM  Result Value Ref Range   HM Diabetic Eye Exam No Retinopathy No Retinopathy      Assessment & Plan:   Problem List Items Addressed This Visit    None    Visit Diagnoses    Arthralgia, unspecified joint    -  Primary   Given migratory nature, strong concern for tick-bourne illness. Will check labs. Start naproxen. Call with any concerns. Await results.    Relevant Orders   Lyme Ab/Western Blot Reflex   RA Qn+CCP(IgG/A)+SjoSSA+SjoSSB   Rocky mtn spotted fvr abs pnl(IgG+IgM)   Babesia microti Antibody Panel   Ehrlichia Antibody Panel   Sedimentation rate   CK       Follow up plan: Return Pending results..Marland Kitchen

## 2016-06-23 NOTE — Patient Instructions (Addendum)
Tick Bite Information, Adult Ticks are insects that draw blood for food. Most ticks live in shrubs and grassy areas. They climb onto people and animals that brush against the leaves and grasses that they rest on. Then they bite, attaching themselves to the skin. Most ticks are harmless, but some ticks carry germs that can spread to a person through a bite and cause a disease. To reduce your risk of getting a disease from a tick bite, it is important to take steps to prevent tick bites. It is also important to check for ticks after being outdoors. If you find that a tick has attached to you, watch for symptoms of disease. How can I prevent tick bites? Take these steps to help prevent tick bites when you are outdoors in an area where ticks are found:  Use insect repellent that has DEET (20% or higher), picaridin, or IR3535 in it. Use it on:  Skin that is showing.  The top of your boots.  Your pant legs.  Your sleeve cuffs.  For repellent products that contain permethrin, follow product instructions. Use these products on:  Clothing.  Gear.  Boots.  Tents.  Wear protective clothing. Long sleeves and long pants offer the best protection from ticks.  Wear light-colored clothing so you can see ticks more easily.  Tuck your pant legs into your socks.  If you go walking on a trail, stay in the middle of the trail so your skin, hair, and clothing do not touch the bushes.  Avoid walking through areas with long grass.  Check for ticks on your clothing, hair, and skin often while you are outside, and check again before you go inside. Make sure to check the places that ticks attach themselves most often. These places include the scalp, neck, armpits, waist, groin, and joint areas. Ticks that carry a disease called Lyme disease have to be attached to the skin for 24-48 hours. Checking for ticks every day will lessen your risk of this and other diseases.  When you come indoors, wash your  clothes and take a shower or a bath right away. Dry your clothes in a dryer on high heat for at least 60 minutes. This will kill any ticks in your clothes. What is the proper way to remove a tick? If you find a tick on your body, remove it as soon as possible. Removing a tick sooner rather than later can prevent germs from passing from the tick to your body. To remove a tick that is crawling on your skin but has not bitten:  Go outdoors and brush the tick off.  Remove the tick with tape or a lint roller. To remove a tick that is attached to your skin:  Wash your hands.  If you have latex gloves, put them on.  Use tweezers, curved forceps, or a tick-removal tool to gently grasp the tick as close to your skin and the tick's head as possible.  Gently pull with steady, upward pressure until the tick lets go. When removing the tick:  Take care to keep the tick's head attached to its body.  Do not twist or jerk the tick. This can make the tick's head or mouth break off.  Do not squeeze or crush the tick's body. This could force disease-carrying fluids from the tick into your body. Do not try to remove a tick with heat, alcohol, petroleum jelly, or fingernail polish. Using these methods can cause the tick to salivate and regurgitate into your   bloodstream, increasing your risk of getting a disease. What should I do after removing a tick?  Clean the bite area with soap and water, rubbing alcohol, or an iodine scrub.  If an antiseptic cream or ointment is available, apply a small amount to the bite site.  Wash and disinfect any instruments that you used to remove the tick. How should I dispose of a tick? To dispose of a live tick, use one of these methods:  Place it in rubbing alcohol.  Place it in a sealed bag or container.  Wrap it tightly in tape.  Flush it down the toilet. Contact a health care provider if:  You have symptoms of a disease after a tick bite. Symptoms of a  tick-borne disease can occur from moments after the tick bites to up to 30 days after a tick is removed. Symptoms include:  Muscle, joint, or bone pain.  Difficulty walking or moving your legs.  Numbness in the legs.  Paralysis.  Red rash around the tick bite area that is shaped like a target or a "bull's-eye."  Redness and swelling in the area of the tick bite.  Fever.  Repeated vomiting.  Diarrhea.  Weight loss.  Tender, swollen lymph glands.  Shortness of breath.  Cough.  Pain in the abdomen.  Headache.  Abnormal tiredness.  A change in your level of consciousness.  Confusion. Get help right away if:  You are not able to remove a tick.  A part of a tick breaks off and gets stuck in your skin.  Your symptoms get worse. Summary  Ticks may carry germs that can spread to a person through a bite and cause disease.  Wear protective clothing and use insect repellent to prevent tick bites. Follow product instructions.  If you find a tick on your body, remove it as soon as possible. If the tick is attached, do not try to remove with heat, alcohol, petroleum jelly, or fingernail polish.  Remove the attached tick using tweezers, curved forceps, or a tick-removal tool. Gently pull with steady, upward pressure until the tick lets go. Do not twist or jerk the tick. Do not squeeze or crush the tick's body.  If you have symptoms after being bitten by a tick, contact a health care provider. This information is not intended to replace advice given to you by your health care provider. Make sure you discuss any questions you have with your health care provider. Document Released: 01/28/2000 Document Revised: 11/12/2015 Document Reviewed: 11/12/2015 Elsevier Interactive Patient Education  2017 [employer]  

## 2016-06-27 ENCOUNTER — Encounter: Payer: Self-pay | Admitting: Family Medicine

## 2016-06-28 LAB — BABESIA MICROTI ANTIBODY PANEL
Babesia microti IgG: <1:10 {titer}
Babesia microti IgM: <1:10 {titer}

## 2016-06-28 LAB — CK: CK TOTAL: 122 U/L (ref 24–173)

## 2016-06-28 LAB — RA QN+CCP(IGG/A)+SJOSSA+SJOSSB
CYCLIC CITRULLIN PEPTIDE AB: 7 U (ref 0–19)
ENA SSA (RO) Ab: <0.2 AI (ref 0.0–0.9)

## 2016-06-28 LAB — ROCKY MTN SPOTTED FVR ABS PNL(IGG+IGM)
RMSF IgG: NEGATIVE
RMSF IgM: 0.79 index (ref 0.00–0.89)

## 2016-06-28 LAB — LYME AB/WESTERN BLOT REFLEX
LYME DISEASE AB, QUANT, IGM: <0.8 index (ref 0.00–0.79)
Lyme IgG/IgM Ab: <0.91 {ISR} (ref 0.00–0.90)

## 2016-06-28 LAB — SEDIMENTATION RATE: Sed Rate: 19 mm/hr (ref 0–32)

## 2016-07-05 ENCOUNTER — Encounter: Payer: Self-pay | Admitting: Family Medicine

## 2016-07-05 ENCOUNTER — Ambulatory Visit (INDEPENDENT_AMBULATORY_CARE_PROVIDER_SITE_OTHER): Payer: BLUE CROSS/BLUE SHIELD | Admitting: Family Medicine

## 2016-07-05 VITALS — BP 122/72 | HR 74 | Temp 98.8°F | Wt 249.4 lb

## 2016-07-05 DIAGNOSIS — M654 Radial styloid tenosynovitis [de Quervain]: Secondary | ICD-10-CM | POA: Diagnosis not present

## 2016-07-05 DIAGNOSIS — K219 Gastro-esophageal reflux disease without esophagitis: Secondary | ICD-10-CM | POA: Diagnosis not present

## 2016-07-05 DIAGNOSIS — R079 Chest pain, unspecified: Secondary | ICD-10-CM | POA: Diagnosis not present

## 2016-07-05 DIAGNOSIS — M2550 Pain in unspecified joint: Secondary | ICD-10-CM | POA: Diagnosis not present

## 2016-07-05 MED ORDER — PREDNISONE 10 MG PO TABS: ORAL_TABLET | ORAL | 0 refills | Status: DC

## 2016-07-05 MED ORDER — OMEPRAZOLE 20 MG PO CPDR: 20.0000 mg | DELAYED_RELEASE_CAPSULE | Freq: Every day | ORAL | 1 refills | Status: AC

## 2016-07-05 NOTE — Patient Instructions (Addendum)
De Quervain Tenosynovitis Tendons attach muscles to bones. They also help with joint movements. When tendons become irritated or swollen, it is called tendinitis. The extensor pollicis brevis (EPB) tendon connects the EPB muscle to a bone that is near the base of the thumb. The EPB muscle helps to straighten and extend the thumb. De Quervain tenosynovitis is a condition in which the EPB tendon lining (sheath) becomes irritated, thickened, and swollen. This condition is sometimes called stenosing tenosynovitis. This condition causes pain on the thumb side of the back of the wrist. What are the causes? Causes of this condition include:  Activities that repeatedly cause your thumb and wrist to extend.  A sudden increase in activity or change in activity that affects your wrist. What increases the risk? This condition is more likely to develop in:  Females.  People who have diabetes.  Women who have recently given birth.  People who are over 40 years of age.  People who do activities that involve repeated hand and wrist motions, such as tennis, racquetball, volleyball, gardening, and taking care of children.  People who do heavy labor.  People who have poor wrist strength and flexibility.  People who do not warm up properly before activities. What are the signs or symptoms? Symptoms of this condition include:  Pain or tenderness over the thumb side of the back of the wrist when your thumb and wrist are not moving.  Pain that gets worse when you straighten your thumb or extend your thumb or wrist.  Pain when the injured area is touched.  Locking or catching of the thumb joint while you bend and straighten your thumb.  Decreased thumb motion due to pain.  Swelling over the affected area. How is this diagnosed? This condition is diagnosed with a medical history and physical exam. Your health care provider will ask for details about your injury and ask about your symptoms. How is  this treated? Treatment may include the use of icing and medicines to reduce pain and swelling. You may also be advised to wear a splint or brace to limit your thumb and wrist motion. In less severe cases, treatment may also include working with a physical therapist to strengthen your wrist and calm the irritation around your EPB tendon sheath. In severe cases, surgery may be needed. Follow these instructions at home: If you have a splint or brace:   Wear it as told by your health care provider. Remove it only as told by your health care provider.  Loosen the splint or brace if your fingers become numb and tingle, or if they turn cold and blue.  Keep the splint or brace clean and dry. Managing pain, stiffness, and swelling   If directed, apply ice to the injured area.  Put ice in a plastic bag.  Place a towel between your skin and the bag.  Leave the ice on for 20 minutes, 2-3 times per day.  Move your fingers often to avoid stiffness and to lessen swelling.  Raise (elevate) the injured area above the level of your heart while you are sitting or lying down. General instructions   Return to your normal activities as told by your health care provider. Ask your health care provider what activities are safe for you.  Take over-the-counter and prescription medicines only as told by your health care provider.  Keep all follow-up visits as told by your health care provider. This is important.  Do not drive or operate heavy machinery while taking   prescription pain medicine. Contact a health care provider if:  Your pain, tenderness, or swelling gets worse, even if you have had treatment.  You have numbness or tingling in your wrist, hand, or fingers on the injured side. This information is not intended to replace advice given to you by your health care provider. Make sure you discuss any questions you have with your health care provider. Document Released: 01/30/2005 Document Revised:  07/08/2015 Document Reviewed: 04/07/2014 Elsevier Interactive Patient Education  2017 [employer]  

## 2016-07-05 NOTE — Assessment & Plan Note (Signed)
Better with change in diet. Will go back down to 20mg . Call with any concerns.

## 2016-07-05 NOTE — Progress Notes (Signed)
BP 122/72 (BP Location: Left Arm, Patient Position: Sitting, Cuff Size: Normal)   Pulse 74   Temp 98.8 F (37.1 C)   Wt 249 lb 6.4 oz (113.1 kg)   SpO2 100%   BMI 43.62 kg/m    Subjective:    Patient ID: Brittany Zavala, female    DOB: 11/07/1965, 51 y.o.   MRN: [id number]  HPI: Brittany Zavala is a 51 y.o. female  Chief Complaint  Patient presents with  . Joint Pain  . Generalized Body Aches   CHEST DISCOMFORT Time since onset: last night about 8:30PM Duration: 10 minutes Onset: sudden Quality: warm feeling Severity: No pain Location: left para substernal Radiation: none Frequency: once Related to exertion: no Activity when pain started: watching TV Trauma: no Anxiety/recent stressors: no Aggravating factors:  Alleviating factors:  Status: better Treatments attempted: nothing  Current pain status: pain free Shortness of breath: no Cough: yes, non-productive Nausea: yes Diaphoresis: no Heartburn: no Palpitations: yes  ARTHRALGIAS / JOINT ACHES- not doing any better. Feeling terrible. Hasn't seen rheumatology in 2+ years.  Duration: Off and on for the last month and a half- migratory joint pain Pain: yes Symmetric: no Severity: severe Quality: aching, shooting and throbbing Frequency: constant Context:  worse Decreased function/range of motion: yes  Erythema: yes Swelling: yes Heat or warmth: yes Morning stiffness: yes Aggravating factors: nothing Alleviating factors: heat Relief with NSAIDs?: moderate Treatments attempted:  heat and ibuprofen  Involved Joints:     Hands: yes bilateral    Wrists: yes bilateral     Elbows: yes bilateral    Shoulders: yes bilateral    Back: yes     Hips: yes bilateral    Knees: yes bilateral    Ankles: yes bilateral    Feet: yes bilateral   Relevant past medical, surgical, family and social history reviewed and updated as indicated. Interim medical history since our last visit  reviewed. Allergies and medications reviewed and updated.  Review of Systems  Constitutional: Negative.   Respiratory: Negative.   Cardiovascular: Negative.   Musculoskeletal: Positive for arthralgias. Negative for back pain, gait problem, joint swelling, myalgias, neck pain and neck stiffness.  Neurological: Negative.   Psychiatric/Behavioral: Negative.     Per HPI unless specifically indicated above     Objective:    BP 122/72 (BP Location: Left Arm, Patient Position: Sitting, Cuff Size: Normal)   Pulse 74   Temp 98.8 F (37.1 C)   Wt 249 lb 6.4 oz (113.1 kg)   SpO2 100%   BMI 43.62 kg/m   Wt Readings from Last 3 Encounters:  07/05/16 249 lb 6.4 oz (113.1 kg)  06/23/16 252 lb 11.2 oz (114.6 kg)  05/26/16 252 lb 1.6 oz (114.4 kg)    Physical Exam  Constitutional: She is oriented to person, place, and time. She appears well-developed and well-nourished. No distress.  HENT:  Head: Normocephalic and atraumatic.  Right Ear: Hearing normal.  Left Ear: Hearing normal.  Nose: Nose normal.  Eyes: Conjunctivae and lids are normal. Right eye exhibits no discharge. Left eye exhibits no discharge. No scleral icterus.  Cardiovascular: Normal rate, regular rhythm, normal heart sounds and intact distal pulses.  Exam reveals no gallop and no friction rub.   No murmur heard. Pulmonary/Chest: Effort normal and breath sounds normal. No respiratory distress. She has no wheezes. She has no rales. She exhibits no tenderness.  Musculoskeletal: Normal range of motion.  + Finkelsteins, no sign of swelling of joints, +  tenderness to palpation throughout  Neurological: She is alert and oriented to person, place, and time.  Skin: Skin is warm, dry and intact. No rash noted. No erythema. No pallor.  Psychiatric: She has a normal mood and affect. Her speech is normal and behavior is normal. Judgment and thought content normal. Cognition and memory are normal.  Nursing note and vitals  reviewed.   Results for orders placed or performed in visit on 06/23/16  Lyme Ab/Western Blot Reflex  Result Value Ref Range   Lyme IgG/IgM Ab <0.91 0.00 - 0.90 ISR   LYME DISEASE AB, QUANT, IGM <0.80 0.00 - 0.79 index  RA Qn+CCP(IgG/A)+SjoSSA+SjoSSB  Result Value Ref Range   Rhuematoid fact SerPl-aCnc <10.0 0.0 - 13.9 IU/mL   ENA SSA (RO) Ab <0.2 0.0 - 0.9 AI   ENA SSB (LA) Ab <0.2 0.0 - 0.9 AI   Cyclic Citrullin Peptide Ab 7 0 - 19 units  Rocky mtn spotted fvr abs pnl(IgG+IgM)  Result Value Ref Range   RMSF IgG Negative Negative   RMSF IgM 0.79 0.00 - 0.89 index  Babesia microti Antibody Panel  Result Value Ref Range   Babesia microti IgM <1:10 Neg:<1:10   Babesia microti IgG <1:10 Neg:<1:10  Sedimentation rate  Result Value Ref Range   Sed Rate 19 0 - 32 mm/hr  CK  Result Value Ref Range   Total CK 122 24 - 173 U/L      Assessment & Plan:   Problem List Items Addressed This Visit      Digestive   GERD (gastroesophageal reflux disease)    Better with change in diet. Will go back down to 20mg . Call with any concerns.       Relevant Medications   omeprazole (PRILOSEC) 20 MG capsule    Other Visit Diagnoses    Chest pain, unspecified type    -  Primary   EKG normal. ?Stress. Continue to monitor. Call with any concerns.    Relevant Orders   EKG 12-Lead (Completed)   Arthralgia, unspecified joint       Will give prednisone taper to see if it helps. Labs negative. Will get her into see rheumatology. She prefers [facility]. Referral generated. Continue weight loss.    Relevant Orders   Ambulatory referral to Rheumatology   De Quervain's tenosynovitis, right       Will treat with stretches, Given today. Call with any concerns.        Follow up plan: Return As scheduled.

## 2016-07-12 ENCOUNTER — Inpatient Hospital Stay: Admission: RE | Admit: 2016-07-12 | Payer: BLUE CROSS/BLUE SHIELD | Source: Ambulatory Visit

## 2016-07-18 ENCOUNTER — Ambulatory Visit
Admission: RE | Admit: 2016-07-18 | Discharge: 2016-07-18 | Disposition: A | Discharge status: Home / Self Care | Payer: BLUE CROSS/BLUE SHIELD | Source: Ambulatory Visit | Attending: Family Medicine | Admitting: Family Medicine

## 2016-07-18 DIAGNOSIS — Z1231 Encounter for screening mammogram for malignant neoplasm of breast: Secondary | ICD-10-CM | POA: Diagnosis not present

## 2016-07-19 ENCOUNTER — Telehealth: Payer: Self-pay | Admitting: Family Medicine

## 2016-07-19 NOTE — Telephone Encounter (Signed)
Brittany Zavala called in regards to a fax that was not received regarding lab work, demographics, and clinical notes.   Fax Number: [phone] Attn: Brittany Zavala    Call Back number to be reached: [phone]  Please Advise.  Thank you.

## 2016-07-21 NOTE — Telephone Encounter (Signed)
This is the second fax I'm sending. I never send a referral without demographics or progress notes. Last they faxed and said they needed patient's labs which was faxed. Refaxing OV notes, labs and demographics faxed.

## 2016-08-11 ENCOUNTER — Other Ambulatory Visit: Payer: Self-pay | Admitting: Family Medicine

## 2016-09-06 ENCOUNTER — Other Ambulatory Visit: Payer: Self-pay | Admitting: Family Medicine

## 2016-09-06 ENCOUNTER — Telehealth: Payer: Self-pay

## 2016-09-06 ENCOUNTER — Telehealth: Payer: Self-pay | Admitting: Family Medicine

## 2016-09-06 ENCOUNTER — Other Ambulatory Visit: Payer: BLUE CROSS/BLUE SHIELD

## 2016-09-06 DIAGNOSIS — R300 Dysuria: Secondary | ICD-10-CM

## 2016-09-06 MED ORDER — NITROFURANTOIN MONOHYD MACRO 100 MG PO CAPS: 100.0000 mg | ORAL_CAPSULE | Freq: Two times a day (BID) | ORAL | 0 refills | Status: AC

## 2016-09-06 NOTE — Telephone Encounter (Signed)
Patient notified

## 2016-09-06 NOTE — Telephone Encounter (Signed)
Please let Brittany DresserConnie know she does have a UTI and I sent an antibiotic to her pharmacy.

## 2016-09-06 NOTE — Telephone Encounter (Signed)
Patient called for UTI like symptoms. Spoke with Dr.Johnson. Appt made for lab for urinalysis today. Pt confirmed.

## 2016-09-07 LAB — UA/M W/RFLX CULTURE, ROUTINE
BILIRUBIN UA: NEGATIVE
GLUCOSE, UA: NEGATIVE
KETONES UA: NEGATIVE
NITRITE UA: NEGATIVE
PROTEIN UA: NEGATIVE
Specific Gravity, UA: 1.01 (ref 1.005–1.030)
UUROB: 0.2 mg/dL (ref 0.2–1.0)
pH, UA: 5.5 (ref 5.0–7.5)

## 2016-09-07 LAB — MICROSCOPIC EXAMINATION
BACTERIA UA: NONE SEEN
CAST TYPE: NONE SEEN
CASTS: NONE SEEN /LPF
CRYSTAL TYPE: NONE SEEN
Crystals: NONE SEEN
MUCUS UA: NONE SEEN
Renal Epithel, UA: NONE SEEN /hpf
Trichomonas, UA: NONE SEEN
Yeast, UA: NONE SEEN

## 2016-09-15 ENCOUNTER — Other Ambulatory Visit: Payer: Self-pay | Admitting: Family Medicine

## 2016-10-02 ENCOUNTER — Other Ambulatory Visit: Payer: Self-pay | Admitting: Family Medicine

## 2016-10-05 ENCOUNTER — Other Ambulatory Visit: Payer: Self-pay | Admitting: Family Medicine

## 2016-10-06 ENCOUNTER — Emergency Department: Payer: BLUE CROSS/BLUE SHIELD

## 2016-10-06 ENCOUNTER — Emergency Department
Admission: EM | Admit: 2016-10-06 | Discharge: 2016-10-06 | Disposition: A | Discharge status: Home / Self Care | Payer: BLUE CROSS/BLUE SHIELD | Attending: Emergency Medicine | Admitting: Emergency Medicine

## 2016-10-06 ENCOUNTER — Other Ambulatory Visit: Payer: Self-pay

## 2016-10-06 ENCOUNTER — Encounter: Payer: Self-pay | Admitting: Emergency Medicine

## 2016-10-06 DIAGNOSIS — I10 Essential (primary) hypertension: Secondary | ICD-10-CM | POA: Insufficient documentation

## 2016-10-06 DIAGNOSIS — E109 Type 1 diabetes mellitus without complications: Secondary | ICD-10-CM | POA: Insufficient documentation

## 2016-10-06 DIAGNOSIS — R079 Chest pain, unspecified: Secondary | ICD-10-CM | POA: Diagnosis present

## 2016-10-06 DIAGNOSIS — R109 Unspecified abdominal pain: Secondary | ICD-10-CM

## 2016-10-06 DIAGNOSIS — R05 Cough: Secondary | ICD-10-CM | POA: Insufficient documentation

## 2016-10-06 DIAGNOSIS — I259 Chronic ischemic heart disease, unspecified: Secondary | ICD-10-CM | POA: Diagnosis not present

## 2016-10-06 DIAGNOSIS — F17210 Nicotine dependence, cigarettes, uncomplicated: Secondary | ICD-10-CM | POA: Diagnosis not present

## 2016-10-06 DIAGNOSIS — M549 Dorsalgia, unspecified: Secondary | ICD-10-CM | POA: Diagnosis not present

## 2016-10-06 DIAGNOSIS — R1013 Epigastric pain: Secondary | ICD-10-CM | POA: Insufficient documentation

## 2016-10-06 DIAGNOSIS — Z794 Long term (current) use of insulin: Secondary | ICD-10-CM | POA: Diagnosis not present

## 2016-10-06 DIAGNOSIS — Z79899 Other long term (current) drug therapy: Secondary | ICD-10-CM | POA: Diagnosis not present

## 2016-10-06 DIAGNOSIS — M546 Pain in thoracic spine: Secondary | ICD-10-CM

## 2016-10-06 LAB — CBC
HCT: 39 % (ref 35.0–47.0)
HEMOGLOBIN: 12.9 g/dL (ref 12.0–16.0)
MCH: 27.4 pg (ref 26.0–34.0)
MCHC: 33 g/dL (ref 32.0–36.0)
MCV: 83 fL (ref 80.0–100.0)
PLATELETS: 247 10*3/uL (ref 150–440)
RBC: 4.7 MIL/uL (ref 3.80–5.20)
RDW: 16.8 % — ABNORMAL HIGH (ref 11.5–14.5)
WBC: 13.2 10*3/uL — ABNORMAL HIGH (ref 3.6–11.0)

## 2016-10-06 LAB — BASIC METABOLIC PANEL
ANION GAP: 8 (ref 5–15)
BUN: 8 mg/dL (ref 6–20)
CALCIUM: 9 mg/dL (ref 8.9–10.3)
CO2: 23 mmol/L (ref 22–32)
Chloride: 105 mmol/L (ref 101–111)
Creatinine, Ser: 0.74 mg/dL (ref 0.44–1.00)
GFR calc non Af Amer: >60 mL/min (ref >60)
GLUCOSE: 185 mg/dL — AB (ref 65–99)
Potassium: 4 mmol/L (ref 3.5–5.1)
Sodium: 136 mmol/L (ref 135–145)

## 2016-10-06 LAB — HEPATIC FUNCTION PANEL
ALT: 14 U/L (ref 14–54)
AST: 24 U/L (ref 15–41)
Albumin: 3.7 g/dL (ref 3.5–5.0)
Alkaline Phosphatase: 69 U/L (ref 38–126)
Total Bilirubin: 0.4 mg/dL (ref 0.3–1.2)
Total Protein: 7.4 g/dL (ref 6.5–8.1)

## 2016-10-06 LAB — LIPASE, BLOOD: LIPASE: 33 U/L (ref 11–51)

## 2016-10-06 LAB — TROPONIN I

## 2016-10-06 MED ORDER — ONDANSETRON HCL 4 MG/2ML IJ SOLN
4.0000 mg | Freq: Once | INTRAMUSCULAR | Status: AC
  Administered 2016-10-06: 4 mg via INTRAVENOUS

## 2016-10-06 MED ORDER — ONDANSETRON HCL 4 MG/2ML IJ SOLN
INTRAMUSCULAR | Status: AC
  Administered 2016-10-06: 4 mg via INTRAVENOUS
  Filled 2016-10-06: qty 2

## 2016-10-06 NOTE — ED Notes (Signed)
Pt c/o chest pain and left shoulder pain that started around 11am. Pt states she is having pain when coughing. Pt is Type I diabetic. Pt states she is supposed to be travelling to [place] to move daughter to [place] for college.

## 2016-10-06 NOTE — ED Triage Notes (Signed)
Pt ambulatory to triage in NAD, report left sided CP starting last night, radiating into left shoulder blade, reports has had ongoing nausea over the past week.  Resp equal and unlabored at this time.

## 2016-10-06 NOTE — ED Notes (Signed)
Lab notified no results for hepatic function or lipase, states they are running it on the machine right now.

## 2016-10-06 NOTE — Discharge Instructions (Signed)
You would prefer not to stay for further evaluation, this is certainly your choice but it does limit to some extent our ability to evaluate you. If you have chest pain or shortness of breath or any other new or worrisome symptoms please return to the emergency department. Follow closely with your heart doctor on Monday or later today,if you're cough gets worse you have increasing pain, shortness of breath, nausea, vomiting, or any other concerning symptoms return to the emergency room. We did note that you have a possible 5 mm polyp on your ultrasound, this also could be a small gallstone. Please follow-up with your primary care doctor for this.

## 2016-10-06 NOTE — ED Provider Notes (Addendum)
[facility] Emergency Department Provider Note  ____________________________________________   I have reviewed the triage vital signs and the nursing notes.   HISTORY  Chief Complaint Chest Pain    HPI Brittany Zavala is a 51 y.o. female With no history personally or family of PE or DVT no recent travel, does have a history of diabetes and anxiety, she had a heart catheter in 2014 that showed a small distal lesion which was not stented, and she had a negative cardiac workup in 2017 including echo, presents today with multiple complaints. Patient states that she has pain in her shoulder blade on the left side when she coughs. Patient is had a productive cough for the last week which has been getting worse. She does continue to smoke. When she coughs it hurts. She denies any other pleuritic symptoms. She denies feeling short of breath. She denies any leg swelling, recent travel or calf pain. She states that she also has had nausea with food for the last several weeks and sometimes epigastric abdominal discomfort. She is not having exertional symptoms of any variety. The left shoulder blade pain is worse when she changes position or lies on it or moves her arm. No recollected trauma. It did begin during a coughing fit.   Past Medical History:  Diagnosis Date  . ANA positive Oct 2015   1:160 speckled  . Anxiety   . CAD (coronary artery disease)   . Chronic UTI   . Current use of insulin (HCC)   . Depression   . Furuncle of right axilla   . Gall bladder polyp 3/16   On UA- needs follow up scan 6 months later, (9/16)  . Gallbladder polyp March 2016   needs f/u scan in Sept 2016  . Hematuria   . Hyperlipidemia   . Hypertension   . Insomnia   . Insomnia   . Tobacco abuse   . Type 1 diabetes mellitus [facility])     Patient Active Problem List   Diagnosis Date Noted  . Hiatal hernia 03/24/2016  . Family history of pheochromocytoma 01/22/2015  .  Pulmonary nodules/lesions, multiple 01/11/2015  . Hypoglycemia associated with diabetes (HCC) 08/27/2014  . Acute hemorrhagic enterocolitis (HCC) 08/27/2014  . Leukocytosis 08/27/2014  . Abdominal pain 08/27/2014  . Campylobacter enteritis 08/27/2014  . GI bleed 08/25/2014  . Anxiety 08/25/2014  . HLD (hyperlipidemia) 08/25/2014  . HTN (hypertension) 08/25/2014  . GERD (gastroesophageal reflux disease) 08/25/2014  . Type 1 diabetes (HCC) 08/25/2014  . Tobacco abuse   . Current use of insulin (HCC)   . Insomnia   . CAD (coronary artery disease)   . Gallbladder polyp 04/14/2014  . ANA positive 11/13/2013    Past Surgical History:  Procedure Laterality Date  . APPENDECTOMY    . CARDIAC CATHETERIZATION    . CESAREAN SECTION    . CESAREAN SECTION     x2  . NASAL SINUS SURGERY    . TONSILECTOMY/ADENOIDECTOMY WITH MYRINGOTOMY      Prior to Admission medications   Medication Sig Start Date End Date Taking? Authorizing Provider  amLODipine (NORVASC) 5 MG tablet Take 1 tablet (5 mg total) by mouth daily. 09/14/15 12/13/15  Almond Lint, MD  BD PEN NEEDLE NANO U/F 32G X 4 MM MISC See admin instructions. Use as directed. 03/06/14   [provider]  hydrOXYzine (ATARAX/VISTARIL) 25 MG tablet Take 1 tablet (25 mg total) by mouth at bedtime as needed. 12/21/15   Dorcas Carrow,  DO  insulin aspart (NOVOLOG) 100 UNIT/ML injection USE UP TO 130 UNITS PER DAY IN INSULIN PUMP 05/11/16   [provider]  Insulin Human (INSULIN PUMP) SOLN Inject into the skin continuous. Use as directed continuously.    [provider]  isosorbide mononitrate (IMDUR) 30 MG 24 hr tablet TAKE 1 TABLET BY MOUTH DAILY 09/15/16   Johnson, Megan P, DO  ketoconazole (NIZORAL) 2 % shampoo APPLY TO AFFECTED AREAS AND LATHER LET SIT FOR 5 MINUTES THEN RINSE REPEAT ONCE A DAY FOR ONE WEEK THEN USE ONLY ONCE A WEEK 09/20/15   Johnson, Megan P, DO  metoprolol succinate (TOPROL-XL) 25 MG 24 hr tablet Take  1.5 tablets (37.5 mg total) by mouth daily. 12/21/15   Johnson, Megan P, DO  naproxen (NAPROSYN) 500 MG tablet Take 1 tablet (500 mg total) by mouth 2 (two) times daily with a meal. 06/23/16   Johnson, Megan P, DO  nitrofurantoin, macrocrystal-monohydrate, (MACROBID) 100 MG capsule Take 1 capsule (100 mg total) by mouth 2 (two) times daily. 09/06/16   Johnson, Megan P, DO  omeprazole (PRILOSEC) 20 MG capsule Take 1 capsule (20 mg total) by mouth daily. 07/05/16   Johnson, Megan P, DO  ondansetron (ZOFRAN ODT) 4 MG disintegrating tablet Take 1 tablet (4 mg total) by mouth every 6 (six) hours as needed for nausea or vomiting. 10/23/15   Sharyn Creamer, MD  ONE TOUCH ULTRA TEST test strip 1 each by Other route 4 (four) times daily.  03/06/14   [provider]  predniSONE (DELTASONE) 10 MG tablet 6 tabs today, 5 tabs tomorrow, decrease by 1 every day until gone. 07/05/16   Johnson, Megan P, DO  quinapril (ACCUPRIL) 40 MG tablet Take 1 tablet (40 mg total) by mouth daily. 12/21/15   Johnson, Megan P, DO  rosuvastatin (CRESTOR) 10 MG tablet TAKE 1 TABLET BY MOUTH AT BEDTIME FOR CHOLESTEROL 10/02/16   Johnson, Megan P, DO    Allergies Morphine and related; Sulfa antibiotics; Tramadol; Vicodin [hydrocodone-acetaminophen]; and Septra [sulfamethoxazole-trimethoprim]  Family History  Problem Relation Age of Onset  . Cancer Mother        breast  . Stroke Mother   . Dementia Mother   . Addison's disease Mother   . Diabetes Mother   . Heart disease Mother   . Breast cancer Mother 66  . Heart disease Father   . Cancer Sister        breast  . Breast cancer Sister 55  . Asthma Daughter   . Allergies Daughter   . Breast cancer Maternal Aunt     Social History Social History  Substance Use Topics  . Smoking status: Current Every Day Smoker    Packs/day: 0.50    Types: Cigarettes  . Smokeless tobacco: Never Used  . Alcohol use No    Review of Systems Constitutional: No fever/chills Eyes: No  visual changes. ENT: No sore throat. No stiff neck no neck pain Cardiovascular: Denies chest pain. Respiratory: Dpositive productive cough Gastrointestinal:   no vomiting.  No diarrhea.  No constipation. Genitourinary: Negative for dysuria. Musculoskeletal: Negative lower extremity swelling Skin: Negative for rash. Neurological: Negative for severe headaches, focal weakness or numbness.   ____________________________________________   PHYSICAL EXAM:  VITAL SIGNS: ED Triage Vitals  Enc Vitals Group     BP 10/06/16 0707 138/75     Pulse Rate 10/06/16 0707 89     Resp 10/06/16 0707 16     Temp 10/06/16 0707 98.1 F (36.7  C)     Temp Source 10/06/16 0707 Oral     SpO2 10/06/16 0707 100 %     Weight 10/06/16 0704 240 lb (108.9 kg)     Height 10/06/16 0704 5\' 8"  (1.727 m)     Head Circumference --      Peak Flow --      Pain Score 10/06/16 0704 2     Pain Loc --      Pain Edu? --      Excl. in GC? --     Constitutional: Alert and oriented. Well appearing and in no acute distress. Eyes: Conjunctivae are normal Head: Atraumatic HEENT: No congestion/rhinnorhea. Mucous membranes are moist.  Oropharynx non-erythematous Neck:   Nontender with no meningismus, no masses, no stridor Cardiovascular: Normal rate, regular rhythm. Grossly normal heart sounds.  Good peripheral circulation. Respiratory: Normal respiratory effort.  No retractions. Lungs CTAB. Chest: Tender to palpation left scapular region with touch this area patient says "ouch that's the pain right there" and pulls back. No lesions, no crepitus no flail chest, there is no shingles, when I range the patient's arm and also elicits this discomfort. Abdominal: Soft and very slight epigastric/right upper quadrant discomfort. No distention. No guarding no rebound Back:  There is no focal tenderness or step off.  there is no midline tenderness there are no lesions noted. there is no CVA tenderness Musculoskeletal: No lower  extremity tenderness, no upper extremity tenderness. No joint effusions, no DVT signs strong distal pulses no edema Neurologic:  Normal speech and language. No gross focal neurologic deficits are appreciated.  Skin:  Skin is warm, dry and intact. No rash noted. Psychiatric: Mood and affect are normal. Speech and behavior are normal.  ____________________________________________   LABS (all labs ordered are listed, but only abnormal results are displayed)  Labs Reviewed  BASIC METABOLIC PANEL - Abnormal; Notable for the following:       Result Value   Glucose, Bld 185 (*)    All other components within normal limits  CBC - Abnormal; Notable for the following:    WBC 13.2 (*)    RDW 16.8 (*)    All other components within normal limits  TROPONIN I  HEPATIC FUNCTION PANEL  LIPASE, BLOOD   ____________________________________________  EKG  I personally interpreted any EKGs ordered by me or triage Sinus rhythm at 86 bpm no acute ST elevation o acute ST depressionnonspecific ST changes, normal axis unremarkable ECG ____________________________________________  RADIOLOGY  I reviewed any imaging ordered by me or triage that were performed during my shift and, if possible, patient and/or family made aware of any abnormal findings. ____________________________________________   PROCEDURES  Procedure(s) performed: None  Procedures  Critical Care performed: None  ____________________________________________   INITIAL IMPRESSION / ASSESSMENT AND PLAN / ED COURSE  Pertinent labs & imaging results that were available during my care of the patient were reviewed by me and considered in my medical decision making (see chart for details).  Patient here with very reproducible shoulder pain in the context of cough which is productive. Lungs however are clear. She has no exertional symptoms and no PE or DVT risk factors.  _At this time, there does not appear to be clinical evidence to  support the diagnosis of pulmonary embolus, dissection, myocarditis, endocarditis, pericarditis, pericardial tamponade, acute coronary syndrome, pneumothorax, pneumonia, or any other acute intrathoracic pathology that will require admission or acute intervention. Nor is there evidence of any significant intra-abdominal pathology causing this discomfort. Extensive  tobacco counseling has been given to the patient. Because of her history of diabetes, and one small lesion on the prior heart catheter, we did do cardiac enzymes which are, as expected, negative despite pain and dropped at least since yesterday.This is certainly very reassuring.chest x-ray shows no pneumonia which is consistent with exam. Because she has some nausea with eating, history of obesity, and some very minimal epigastric/right upper quadrant discomfort which is certainly not significant, I did do lower function tests and an ultrasound which are pending. Patient is very comfortable at this time. EKG is reassuring. ___________________________________________  ----------------------------------------- 9:39 AM on 10/06/2016 -----------------------------------------  Work appears very reassuring. She and I did discuss antibiotics for her cough she would prefer to defer at feeling this is most likely viral and given her symptoms I feel that is a safe and correct assessment. She has no pulmonary symptoms aside from an occasional cough which she states is only slightly worse than her normalsmoker's cough although a few days ago was bad enough to give her this pain. Patient states that the pain has been uninterrupted the since yesterday. I did offer admission given her history of CAD and she declines. I also offered her a repeat troponin and advised that that would increase our negative predictive by a workup here. Patient and husband voice understanding but would prefer to go home. Patient has had uninterrupted pain since yesterday with a negative  troponin and I do have suspicion that the second troponin would be negative but we can know unless we do it. Family and patient are very much and understanding of the constraints this places on the workup with her adamant that they would like to go home. She would like to go on vacation. Extensive return precautions and follow-up given and understood.   FINAL CLINICAL IMPRESSION(S) / ED DIAGNOSES  Final diagnoses:  Abdominal pain      This chart was dictated using voice recognition software.  Despite best efforts to proofread,  errors can occur which can change meaning.      Jeanmarie Plant, MD 10/06/16 1610    Jeanmarie Plant, MD 10/06/16 [phone]

## 2016-10-06 NOTE — Telephone Encounter (Signed)
Last OV: 07/05/16 Next OV: None on file.   BMP Latest Ref Rng & Units 10/06/2016 05/02/2016 10/23/2015  Glucose 65 - 99 mg/dL 283(T) - 517(O)  BUN 6 - 20 mg/dL 8 10 11   Creatinine 0.44 - 1.00 mg/dL 1.60 0.7 7.37  BUN/Creat Ratio 9 - 23 - - -  Sodium 135 - 145 mmol/L 136 136(A) 133(L)  Potassium 3.5 - 5.1 mmol/L 4.0 4.4 4.4  Chloride 101 - 111 mmol/L 105 - 107  CO2 22 - 32 mmol/L 23 - 21(L)  Calcium 8.9 - 10.3 mg/dL 9.0 - 1.0(G)

## 2016-10-06 NOTE — ED Notes (Signed)
Patient transported to X-ray and then [place].

## 2016-10-06 NOTE — ED Notes (Signed)
Pt asking about test results, informed the patient the MD was on phone and would be with her as soon as he could.

## 2016-11-13 ENCOUNTER — Other Ambulatory Visit: Payer: Self-pay | Admitting: Family Medicine

## 2017-03-25 ENCOUNTER — Other Ambulatory Visit: Payer: Self-pay

## 2017-03-25 ENCOUNTER — Emergency Department
Admission: EM | Admit: 2017-03-25 | Discharge: 2017-03-25 | Disposition: A | Discharge status: Home / Self Care | Payer: BLUE CROSS/BLUE SHIELD | Attending: Emergency Medicine | Admitting: Emergency Medicine

## 2017-03-25 ENCOUNTER — Emergency Department: Payer: BLUE CROSS/BLUE SHIELD

## 2017-03-25 DIAGNOSIS — I2510 Atherosclerotic heart disease of native coronary artery without angina pectoris: Secondary | ICD-10-CM | POA: Diagnosis not present

## 2017-03-25 DIAGNOSIS — F17210 Nicotine dependence, cigarettes, uncomplicated: Secondary | ICD-10-CM | POA: Insufficient documentation

## 2017-03-25 DIAGNOSIS — E109 Type 1 diabetes mellitus without complications: Secondary | ICD-10-CM | POA: Insufficient documentation

## 2017-03-25 DIAGNOSIS — Z79899 Other long term (current) drug therapy: Secondary | ICD-10-CM | POA: Diagnosis not present

## 2017-03-25 DIAGNOSIS — I10 Essential (primary) hypertension: Secondary | ICD-10-CM

## 2017-03-25 LAB — BASIC METABOLIC PANEL
Anion gap: 9 (ref 5–15)
BUN: 9 mg/dL (ref 6–20)
CHLORIDE: 106 mmol/L (ref 101–111)
CO2: 21 mmol/L — ABNORMAL LOW (ref 22–32)
CREATININE: 0.7 mg/dL (ref 0.44–1.00)
Calcium: 8.8 mg/dL — ABNORMAL LOW (ref 8.9–10.3)
Glucose, Bld: 117 mg/dL — ABNORMAL HIGH (ref 65–99)
Potassium: 3.7 mmol/L (ref 3.5–5.1)
SODIUM: 136 mmol/L (ref 135–145)

## 2017-03-25 LAB — TROPONIN I

## 2017-03-25 LAB — CBC
HCT: 35.7 % (ref 35.0–47.0)
Hemoglobin: 11.9 g/dL — ABNORMAL LOW (ref 12.0–16.0)
MCH: 27.7 pg (ref 26.0–34.0)
MCHC: 33.4 g/dL (ref 32.0–36.0)
MCV: 82.9 fL (ref 80.0–100.0)
PLATELETS: 246 10*3/uL (ref 150–440)
RBC: 4.31 MIL/uL (ref 3.80–5.20)
RDW: 16.2 % — AB (ref 11.5–14.5)
WBC: 9.6 10*3/uL (ref 3.6–11.0)

## 2017-03-25 MED ORDER — METOPROLOL SUCCINATE ER 25 MG PO TB24: 37.5000 mg | ORAL_TABLET | Freq: Every day | ORAL | 0 refills | Status: AC

## 2017-03-25 MED ORDER — AMLODIPINE BESYLATE 10 MG PO TABS: 10.0000 mg | ORAL_TABLET | Freq: Every day | ORAL | 0 refills | Status: AC

## 2017-03-25 NOTE — ED Provider Notes (Signed)
[facility] Emergency Department Provider Note ____________________________________________   First MD Initiated Contact with Patient 03/25/17 2145     (approximate)  I have reviewed the triage vital signs and the nursing notes.   HISTORY  Chief Complaint Hypertension; Nausea; and Dizziness    HPI Brittany Zavala is a 52 y.o. female who presents with hypertension, acute onset today, measured to 190s over 100 at home, and associated with pressure in her face and head which she states she has had previously when she has an elevated blood pressure.  Patient also reports mild shortness of breath over the last day, but denies chest pain, severe headache, weakness or lightheadedness.  The patient states that she was recently switched a few weeks ago for metoprolol and amlodipine to carvedilol, but states that her blood pressure has been more poorly controlled since that time.  She states that it is gotten worse in the last 1-2 days despite her being compliant with her medication.   Past Medical History:  Diagnosis Date  . ANA positive Oct 2015   1:160 speckled  . Anxiety   . CAD (coronary artery disease)   . Chronic UTI   . Current use of insulin (HCC)   . Depression   . Furuncle of right axilla   . Gall bladder polyp 3/16   On UA- needs follow up scan 6 months later, (9/16)  . Gallbladder polyp March 2016   needs f/u scan in Sept 2016  . Hematuria   . Hyperlipidemia   . Hypertension   . Insomnia   . Insomnia   . Tobacco abuse   . Type 1 diabetes mellitus [facility])     Patient Active Problem List   Diagnosis Date Noted  . Hiatal hernia 03/24/2016  . Family history of pheochromocytoma 01/22/2015  . Pulmonary nodules/lesions, multiple 01/11/2015  . Hypoglycemia associated with diabetes (HCC) 08/27/2014  . Acute hemorrhagic enterocolitis (HCC) 08/27/2014  . Leukocytosis 08/27/2014  . Abdominal pain 08/27/2014  . Campylobacter enteritis  08/27/2014  . GI bleed 08/25/2014  . Anxiety 08/25/2014  . HLD (hyperlipidemia) 08/25/2014  . HTN (hypertension) 08/25/2014  . GERD (gastroesophageal reflux disease) 08/25/2014  . Type 1 diabetes (HCC) 08/25/2014  . Tobacco abuse   . Current use of insulin (HCC)   . Insomnia   . CAD (coronary artery disease)   . Gallbladder polyp 04/14/2014  . ANA positive 11/13/2013    Past Surgical History:  Procedure Laterality Date  . APPENDECTOMY    . CARDIAC CATHETERIZATION    . CESAREAN SECTION    . CESAREAN SECTION     x2  . NASAL SINUS SURGERY    . TONSILECTOMY/ADENOIDECTOMY WITH MYRINGOTOMY      Prior to Admission medications   Medication Sig Start Date End Date Taking? Authorizing Provider  amLODipine (NORVASC) 10 MG tablet Take 1 tablet (10 mg total) by mouth daily. 03/25/17 04/24/17  Dionne Bucy, MD  BD PEN NEEDLE NANO U/F 32G X 4 MM MISC See admin instructions. Use as directed. 03/06/14   [provider]  hydrOXYzine (ATARAX/VISTARIL) 25 MG tablet Take 1 tablet (25 mg total) by mouth at bedtime as needed. 12/21/15   Johnson, Megan P, DO  insulin aspart (NOVOLOG) 100 UNIT/ML injection USE UP TO 130 UNITS PER DAY IN INSULIN PUMP 05/11/16   [provider]  Insulin Human (INSULIN PUMP) SOLN Inject into the skin continuous. Use as directed continuously.    [provider]  isosorbide mononitrate (IMDUR)  30 MG 24 hr tablet TAKE 1 TABLET BY MOUTH DAILY 09/15/16   Johnson, Megan P, DO  ketoconazole (NIZORAL) 2 % shampoo APPLY TO AFFECTED AREAS AND LATHER LET SIT FOR 5 MINUTES THEN RINSE REPEAT ONCE A DAY FOR ONE WEEK THEN USE ONLY ONCE A WEEK 09/20/15   Johnson, Megan P, DO  metoprolol succinate (TOPROL XL) 25 MG 24 hr tablet Take 1.5 tablets (37.5 mg total) by mouth daily. 03/25/17 04/24/17  Dionne Bucy, MD  naproxen (NAPROSYN) 500 MG tablet Take 1 tablet (500 mg total) by mouth 2 (two) times daily with a meal. 06/23/16   Johnson, Megan P, DO    nitrofurantoin, macrocrystal-monohydrate, (MACROBID) 100 MG capsule Take 1 capsule (100 mg total) by mouth 2 (two) times daily. 09/06/16   Johnson, Megan P, DO  omeprazole (PRILOSEC) 20 MG capsule Take 1 capsule (20 mg total) by mouth daily. 07/05/16   Johnson, Megan P, DO  ondansetron (ZOFRAN ODT) 4 MG disintegrating tablet Take 1 tablet (4 mg total) by mouth every 6 (six) hours as needed for nausea or vomiting. 10/23/15   Sharyn Creamer, MD  ONE TOUCH ULTRA TEST test strip 1 each by Other route 4 (four) times daily.  03/06/14   [provider]  predniSONE (DELTASONE) 10 MG tablet 6 tabs today, 5 tabs tomorrow, decrease by 1 every day until gone. 07/05/16   Johnson, Megan P, DO  quinapril (ACCUPRIL) 40 MG tablet Take 1 tablet (40 mg total) by mouth daily. 12/21/15   Johnson, Megan P, DO  rosuvastatin (CRESTOR) 10 MG tablet TAKE 1 TABLET BY MOUTH AT BEDTIME FOR CHOLESTEROL 10/02/16   Johnson, Megan P, DO    Allergies Morphine and related; Sulfa antibiotics; Tramadol; Vicodin [hydrocodone-acetaminophen]; and Septra [sulfamethoxazole-trimethoprim]  Family History  Problem Relation Age of Onset  . Cancer Mother        breast  . Stroke Mother   . Dementia Mother   . Addison's disease Mother   . Diabetes Mother   . Heart disease Mother   . Breast cancer Mother 48  . Heart disease Father   . Cancer Sister        breast  . Breast cancer Sister 34  . Asthma Daughter   . Allergies Daughter   . Breast cancer Maternal Aunt     Social History Social History   Tobacco Use  . Smoking status: Current Every Day Smoker    Packs/day: 0.50    Types: Cigarettes  . Smokeless tobacco: Never Used  Substance Use Topics  . Alcohol use: No  . Drug use: No    Review of Systems  Constitutional: No fever. Eyes: No visual changes. ENT: No neck pain. Cardiovascular: Denies chest pain. Respiratory: Positive for mild shortness of breath. Gastrointestinal: No nausea, no vomiting.   Genitourinary:  Negative for dysuria.  Musculoskeletal: Negative for back pain. Skin: Negative for rash. Neurological: Positive for facial/frontal head pressure.   ____________________________________________   PHYSICAL EXAM:  VITAL SIGNS: ED Triage Vitals  Enc Vitals Group     BP 03/25/17 2117 (!) 188/80     Pulse Rate 03/25/17 2117 78     Resp 03/25/17 2117 20     Temp 03/25/17 2117 98 F (36.7 C)     Temp Source 03/25/17 2117 Oral     SpO2 03/25/17 2117 100 %     Weight 03/25/17 2118 240 lb (108.9 kg)     Height 03/25/17 2118 5\' 8"  (1.727 m)     Head  Circumference --      Peak Flow --      Pain Score --      Pain Loc --      Pain Edu? --      Excl. in GC? --     Constitutional: Alert and oriented.  Slightly anxious appearing but in no acute distress. Eyes: Conjunctivae are normal.  EOMI. Head: Atraumatic. Nose: No congestion/rhinnorhea. Mouth/Throat: Mucous membranes are moist.   Neck: Normal range of motion.  Cardiovascular: Normal rate, regular rhythm. Grossly normal heart sounds.  Good peripheral circulation. Respiratory: Normal respiratory effort.  No retractions. Lungs CTAB. Gastrointestinal: No distention.  Musculoskeletal: No lower extremity edema.  Extremities warm and well perfused.  Neurologic:  Normal speech and language. No gross focal neurologic deficits are appreciated.  Skin:  Skin is warm and dry. No rash noted. Psychiatric: Mood and affect are normal. Speech and behavior are normal.  ____________________________________________   LABS (all labs ordered are listed, but only abnormal results are displayed)  Labs Reviewed  BASIC METABOLIC PANEL - Abnormal; Notable for the following components:      Result Value   CO2 21 (*)    Glucose, Bld 117 (*)    Calcium 8.8 (*)    All other components within normal limits  CBC - Abnormal; Notable for the following components:   Hemoglobin 11.9 (*)    RDW 16.2 (*)    All other components within normal limits  TROPONIN  I   ____________________________________________  EKG  ED ECG REPORT I, Dionne BucySebastian Dearies Meikle, the attending physician, personally viewed and interpreted this ECG.  Date: 03/25/2017 EKG Time: 2148 Rate: 69 Rhythm: normal sinus rhythm QRS Axis: normal Intervals: normal ST/T Wave abnormalities: Borderline T abnormality inferior Narrative Interpretation: no evidence of acute ischemia; no significant change when compared to EKG of 10/07/2016  ____________________________________________  RADIOLOGY  CXR: No focal infiltrate, pulmonary edema, or other acute findings  ____________________________________________   PROCEDURES  Procedure(s) performed: No  Procedures  Critical Care performed: No ____________________________________________   INITIAL IMPRESSION / ASSESSMENT AND PLAN / ED COURSE  Pertinent labs & imaging results that were available during my care of the patient were reviewed by me and considered in my medical decision making (see chart for details).  52 year old female with history of hypertension status post recent change in her blood pressure medication presents with acutely worsened elevated blood pressures over the last 1-2 days, measured to as high as 197/103 at home.  Patient reports feeling a pressure-like sensation in her face and frontal area which she has had previously when her blood pressure is elevated, but no severe headache.  She also reports mild shortness of breath although this is not present currently, and denies chest pain or other acute symptoms.  Past medical records reviewed in Epic and are noncontributory; the patient was last seen in the ED in August of last year for multiple complaints including cough and chest discomfort with negative workup.  She had negative cardiac workup in 2017.  On exam, vital signs are normal except for blood pressure of 180s over 80s, and the remainder the exam is unremarkable.  EKG is nonischemic and identical to  patient's prior EKG.  Overall the presentation is consistent with essential hypertension.  I suspect that patient's new blood pressure medication is not adequately controlling it.  She was told by a pharmacist to increase the dose but the patient is concerned that when she takes a higher dose her heart rate goes too  low.  At this time I have a low suspicion for acute endorgan dysfunction, however given patient's history of hypertension we will obtain a workup including troponin x1, basic labs, and chest x-ray.  Anticipate discharge home with no further increase in the blood pressure and negative workup.  No indication for acute intervention at this time.  ----------------------------------------- 11:44 PM on 03/25/2017 -----------------------------------------  On reassessment, patient continues to appear relatively comfortable.  Her vital signs are stable.  The workup in the ED is negative.  Chest x-ray and troponin are negative, and her creatinine is within normal range.   Patient feels that her prior regimen controlled her blood pressure somewhat better although still not completely adequately.  Based on discussion with the patient, we agreed to restart her on her old blood pressure regimen of metoprolol and amlodipine, but increase the dose of the amlodipine.  I will provide prescriptions for these, but I instructed the patient to contact her primary care doctor as soon as possible to arrange for follow-up, have the blood pressure rechecked, and then discuss whether to keep this regimen, or what her long-term regimen should be.  Patient agrees with the plan.  Return precautions given, the patient expresses understanding.  She feels well to go home.     ____________________________________________   FINAL CLINICAL IMPRESSION(S) / ED DIAGNOSES  Final diagnoses:  Hypertension, unspecified type      NEW MEDICATIONS STARTED DURING THIS VISIT:  New Prescriptions   AMLODIPINE (NORVASC) 10  MG TABLET    Take 1 tablet (10 mg total) by mouth daily.   METOPROLOL SUCCINATE (TOPROL XL) 25 MG 24 HR TABLET    Take 1.5 tablets (37.5 mg total) by mouth daily.     Note:  This document was prepared using Dragon voice recognition software and may include unintentional dictation errors.    Dionne Bucy, MD 03/25/17 [phone]

## 2017-03-25 NOTE — Discharge Instructions (Signed)
You should discontinue the blood pressure medication you are currently on, and restart the 1-1/2 pills of metoprolol daily.  I have also prescribed 10 mg of amlodipine to take instead of the 5 mg here previously on.  You should call your doctor to arrange follow-up within the next 1-2 weeks, to determine the ideal long-term blood pressure regimen.  Return to the ER for new, worsening, or persistent high blood pressure readings, headache or pressure, difficulty breathing, chest pain, or any other new or worsening symptoms that concern you.

## 2017-03-25 NOTE — ED Triage Notes (Signed)
Pt states that her dr has changed her bp meds 2 weeks ago, pt states that she has been having pressure in her face, pt reports that she knows her bp is elevated when she has this sensation, but states that she is also having dizziness and nausea so she checked her bp at home and got 197/103

## 2017-03-25 NOTE — ED Notes (Signed)
Reviewed discharge instructions, follow-up care, and prescriptions with patient. Patient verbalized understanding of all information reviewed. Patient stable, with no distress noted at this time.    

## 2017-04-28 IMAGING — CT CT HEAD W/O CM
2 series · 16 of 30 positions shown, 20 images · non-contrast
Comparison: None.

CLINICAL DATA: 42-year-old female with dizziness and headache. No
known injury.

EXAM:
CT HEAD WITHOUT CONTRAST
TECHNIQUE: Contiguous axial images were obtained from the base of the skull
through the vertex without intravenous contrast.

[Series 2: head wo · axial · 0.41mm/px · z∈[+568,+693]mm · 13 of 31 slices shown, 17 images]
[im 3/31  brain]
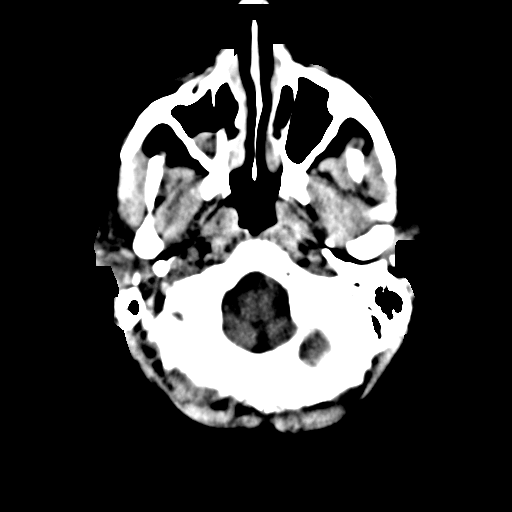
[im 3/31  bone]
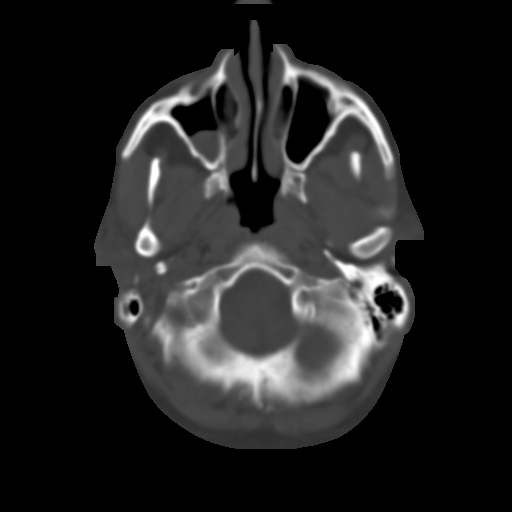
[im 5/31  brain]
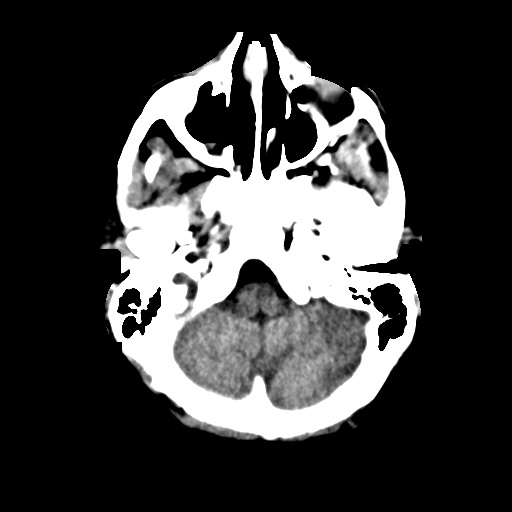
[im 7/31  brain]
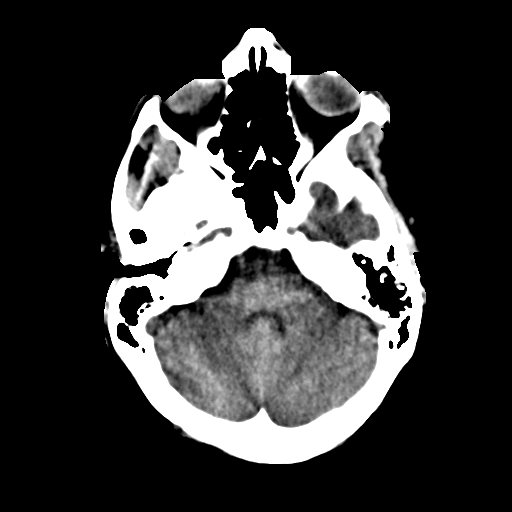
[im 9/31  brain]
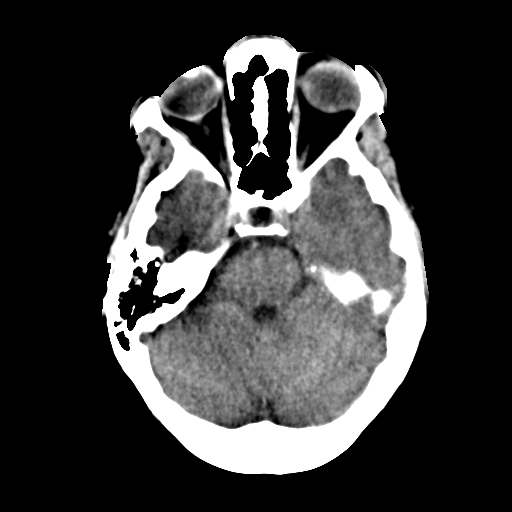
[im 11/31  brain]
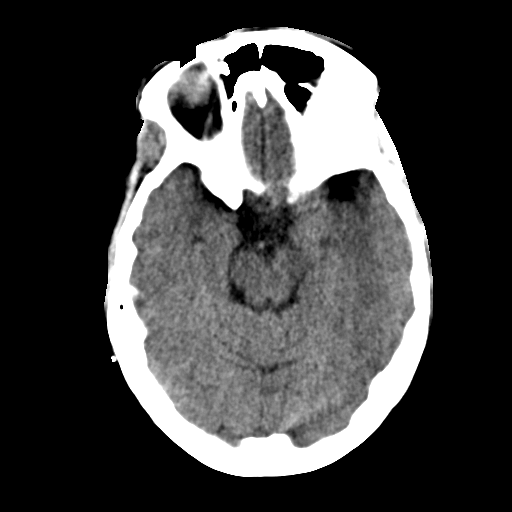
[im 11/31  bone]
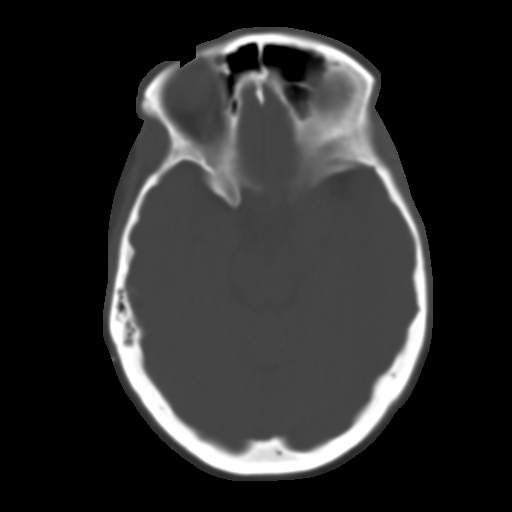
[im 13/31  brain]
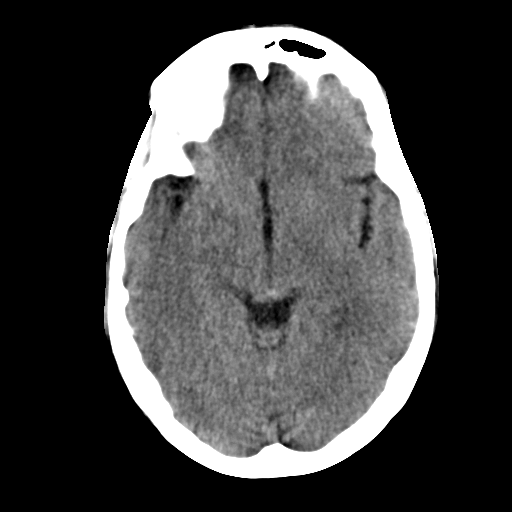
[im 16/31  brain]
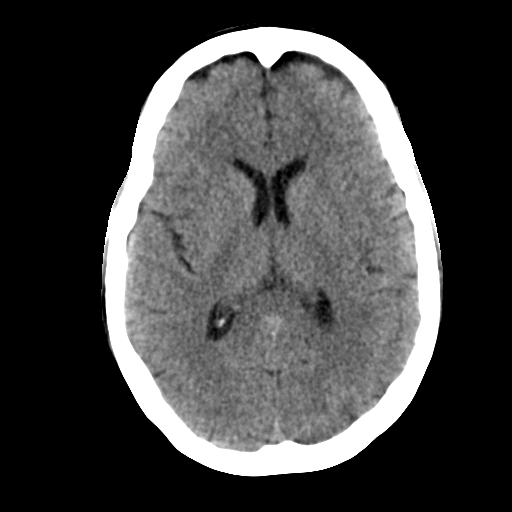
[im 18/31  brain]
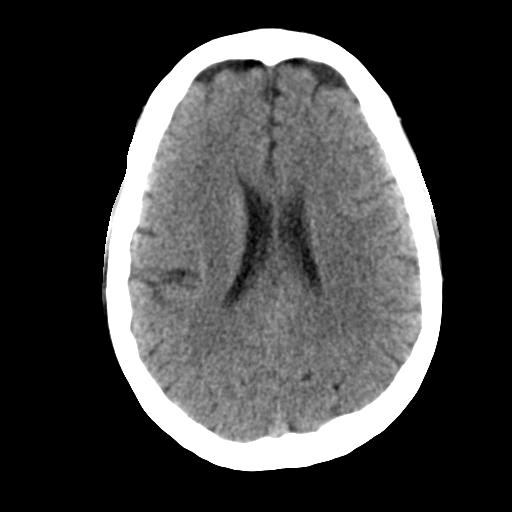
[im 20/31  brain]
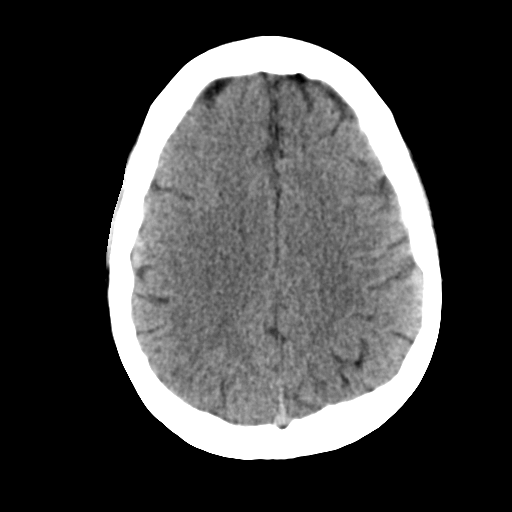
[im 20/31  bone]
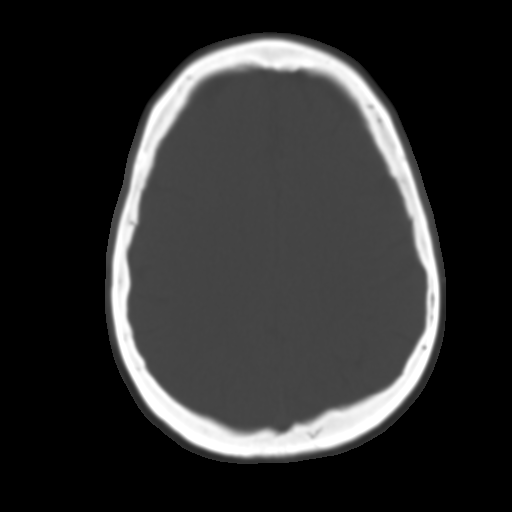
[im 22/31  brain]
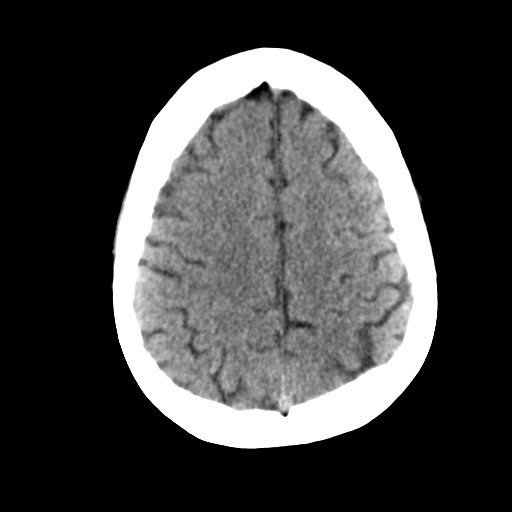
[im 24/31  brain]
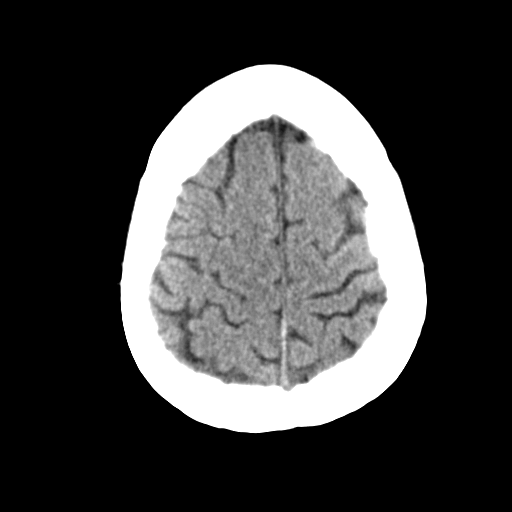
[im 26/31  brain]
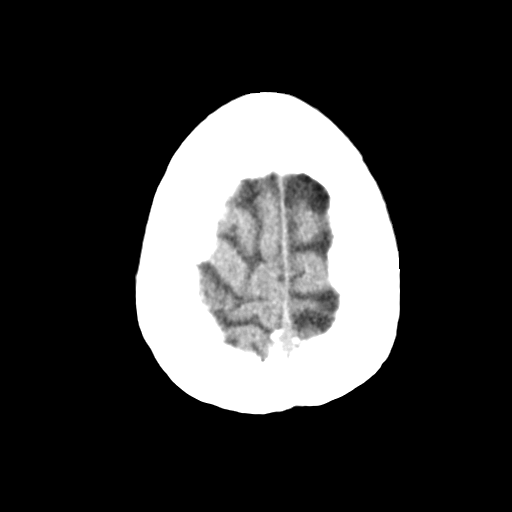
[im 28/31  brain]
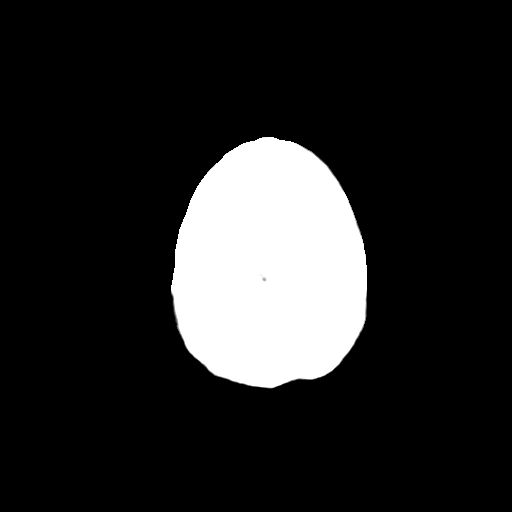
[im 28/31  bone]
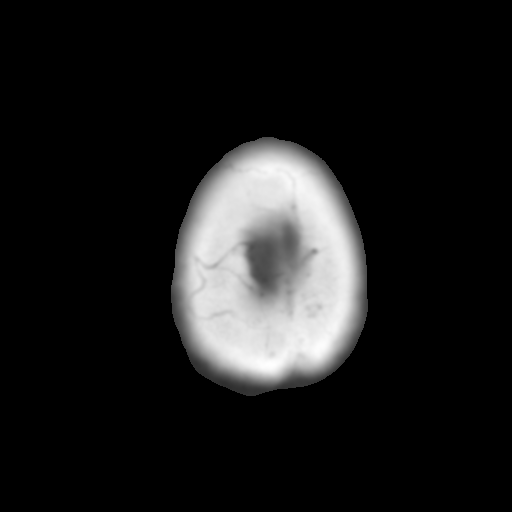

[Series 4: head wo recon · axial · 0.41mm/px · z∈[+606,+644]mm · 3 of 31 slices shown]
[im 3/31  brain]
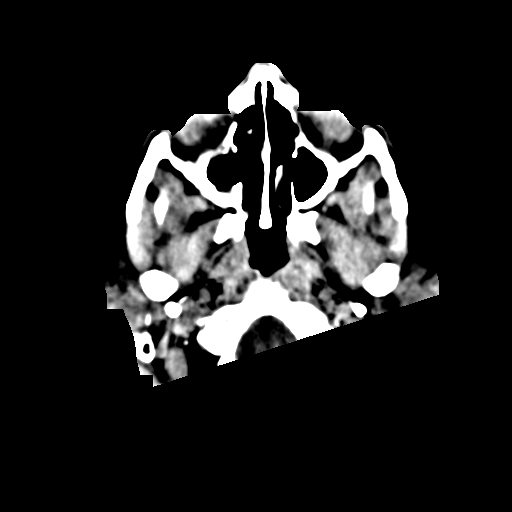
[im 7/31  brain]
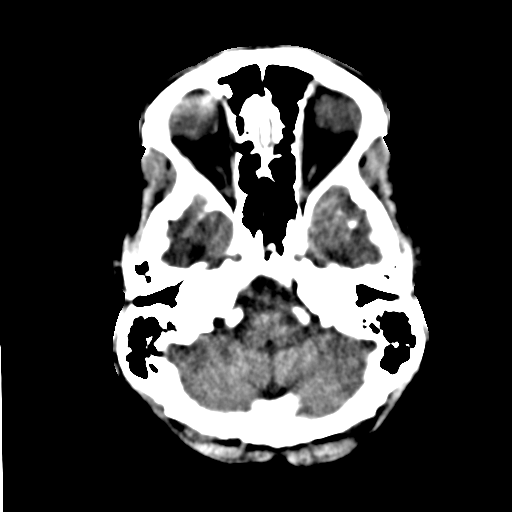
[im 11/31  brain]
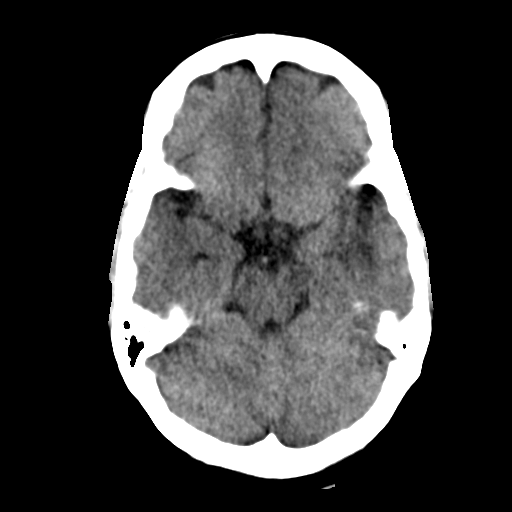

[16 of 30 positions shown; findings below may reference images not displayed]

FINDINGS: The ventricles and the sulci are appropriate in size for the
patient's age. There is no intracranial hemorrhage. No midline shift
or mass effect identified. The gray-white matter differentiation is
preserved.

There is partial opacification of the right maxillary sinus. No
air-fluid level. The remainder of the visualized paranasal sinuses
and mastoid air cells are clear. The calvarium is intact.
IMPRESSION: No acute intracranial pathology.

## 2017-05-19 NOTE — Progress Notes (Deleted)
Cardiology Office Note  Date:  05/19/2017   ID:  Zella RicherConstance Kathleen Zavala, DOB [birth year], MRN [id number]  PCP:  System, Pcp Not In   No chief complaint on file.   HPI:  Brittany Zavala is a 52 y.o. female    chest pain  family history of CAD.    exercise stress test. abnormal   heart cath.  95% distal PDA disease, medical management, 2014   twinges in the chest despite being on Imdur.  possible colitis with blood in the stool.   Patient denies fever, cough, colds, abdominal pain. No PND, orthopnea, edema.    PMH:   has a past medical history of ANA positive (Oct 2015), Anxiety, CAD (coronary artery disease), Chronic UTI, Current use of insulin (HCC), Depression, Furuncle of right axilla, Gall bladder polyp (3/16), Gallbladder polyp (March 2016), Hematuria, Hyperlipidemia, Hypertension, Insomnia, Insomnia, Tobacco abuse, and Type 1 diabetes mellitus (HCC).  PSH:    Past Surgical History:  Procedure Laterality Date  . APPENDECTOMY    . CARDIAC CATHETERIZATION    . CESAREAN SECTION    . CESAREAN SECTION     x2  . NASAL SINUS SURGERY    . TONSILECTOMY/ADENOIDECTOMY WITH MYRINGOTOMY      Current Outpatient Medications  Medication Sig Dispense Refill  . amLODipine (NORVASC) 10 MG tablet Take 1 tablet (10 mg total) by mouth daily. 30 tablet 0  . BD PEN NEEDLE NANO U/F 32G X 4 MM MISC See admin instructions. Use as directed.    . hydrOXYzine (ATARAX/VISTARIL) 25 MG tablet Take 1 tablet (25 mg total) by mouth at bedtime as needed. 30 tablet 7  . insulin aspart (NOVOLOG) 100 UNIT/ML injection USE UP TO 130 UNITS PER DAY IN INSULIN PUMP    . Insulin Human (INSULIN PUMP) SOLN Inject into the skin continuous. Use as directed continuously.    . isosorbide mononitrate (IMDUR) 30 MG 24 hr tablet TAKE 1 TABLET BY MOUTH DAILY 90 tablet 1  . ketoconazole (NIZORAL) 2 % shampoo APPLY TO AFFECTED AREAS AND LATHER LET SIT FOR 5 MINUTES THEN RINSE REPEAT ONCE A DAY FOR ONE  WEEK THEN USE ONLY ONCE A WEEK 120 mL 1  . metoprolol succinate (TOPROL XL) 25 MG 24 hr tablet Take 1.5 tablets (37.5 mg total) by mouth daily. 45 tablet 0  . naproxen (NAPROSYN) 500 MG tablet Take 1 tablet (500 mg total) by mouth 2 (two) times daily with a meal. 60 tablet 3  . nitrofurantoin, macrocrystal-monohydrate, (MACROBID) 100 MG capsule Take 1 capsule (100 mg total) by mouth 2 (two) times daily. 14 capsule 0  . omeprazole (PRILOSEC) 20 MG capsule Take 1 capsule (20 mg total) by mouth daily. 90 capsule 1  . ondansetron (ZOFRAN ODT) 4 MG disintegrating tablet Take 1 tablet (4 mg total) by mouth every 6 (six) hours as needed for nausea or vomiting. 20 tablet 0  . ONE TOUCH ULTRA TEST test strip 1 each by Other route 4 (four) times daily.     . predniSONE (DELTASONE) 10 MG tablet 6 tabs today, 5 tabs tomorrow, decrease by 1 every day until gone. 21 tablet 0  . quinapril (ACCUPRIL) 40 MG tablet Take 1 tablet (40 mg total) by mouth daily. 30 tablet 6  . rosuvastatin (CRESTOR) 10 MG tablet TAKE 1 TABLET BY MOUTH AT BEDTIME FOR CHOLESTEROL 90 tablet 1   No current facility-administered medications for this visit.      Allergies:   Morphine and related; Sulfa antibiotics;  Tramadol; Vicodin [hydrocodone-acetaminophen]; and Septra [sulfamethoxazole-trimethoprim]   Social History:  The patient  reports that she has been smoking cigarettes.  She has been smoking about 0.50 packs per day. She has never used smokeless tobacco. She reports that she does not drink alcohol or use drugs.   Family History:   family history includes Addison's disease in her mother; Allergies in her daughter; Asthma in her daughter; Breast cancer in her maternal aunt; Breast cancer (age of onset: 63) in her sister; Breast cancer (age of onset: 27) in her mother; Cancer in her mother and sister; Dementia in her mother; Diabetes in her mother; Heart disease in her father and mother; Stroke in her mother.    Review of  Systems: ROS   PHYSICAL EXAM: VS:  There were no vitals taken for this visit. , BMI There is no height or weight on file to calculate BMI. GEN: Well nourished, well developed, in no acute distress HEENT: normal Neck: no JVD, carotid bruits, or masses Cardiac: RRR; no murmurs, rubs, or gallops,no edema  Respiratory:  clear to auscultation bilaterally, normal work of breathing GI: soft, nontender, nondistended, + BS Brittany: no deformity or atrophy Skin: warm and dry, no rash Neuro:  Strength and sensation are intact Psych: euthymic mood, full affect    Recent Labs: 10/06/2016: ALT 14 03/25/2017: BUN 9; Creatinine, Ser 0.70; Hemoglobin 11.9; Platelets 246; Potassium 3.7; Sodium 136    Lipid Panel Lab Results  Component Value Date   CHOL 161 05/02/2016   HDL 51 05/02/2016   LDLCALC 84 05/02/2016   TRIG 128 05/02/2016      Wt Readings from Last 3 Encounters:  03/25/17 240 lb (108.9 kg)  10/06/16 240 lb (108.9 kg)  07/05/16 249 lb 6.4 oz (113.1 kg)       ASSESSMENT AND PLAN:  No diagnosis found.   Disposition:   F/U  6 months  No orders of the defined types were placed in this encounter.    Signed, Dossie Arbour, M.D., Ph.D. 05/19/2017  [facility] [place], [place] [phone]

## 2017-05-22 ENCOUNTER — Ambulatory Visit: Payer: BLUE CROSS/BLUE SHIELD | Admitting: Cardiovascular Disease

## 2017-07-15 NOTE — Progress Notes (Signed)
Cardiology Office Note  Date:  07/17/2017   ID:  Daneli, Butkiewicz 1965-10-23, MRN [id number]  PCP:  System, Pcp Not In   Chief Complaint  Patient presents with  . Other    Former [employer] patient. Patient c/o SOB and chest pain. Last seen 11/2015. Meds reviewed verbally with patient.     HPI:  Brittany Zavala is a 52 y.o. female  chest pain shortness breath  Occasional twinges in the chest. HTN Smoking, 1 ppd Diabetes I Cath 03/2012 with 95 small PDA disease, medical management She presents for routine follow-up of her coronary artery disease  She's having some atypical chest pain Present at rest Lots of stressors going on, she is moving [place],  Husband has jobFurther [place], she has been packing up the house Son with sepsis bacteremia long period of time in the hospital has eosinophilia  Lab work reviewed with her in detail from [facility] HBa1C 6.8 Total 145, LDL 69  Not walking, weight running high Smoking 1 ppd Previously down to 5 cigarettes but stress caused her to smoke more  Some chest discomfort cleaning the oven Seems to be atypical not with exertion and in general  EKG personally reviewed by myself on todays visit Shows normal sinus rhythm with rate 70 bpm no significant ST or T-wave changes  PMH:   has a past medical history of ANA positive (Oct 2015), Anxiety, CAD (coronary artery disease), Chronic UTI, Current use of insulin (HCC), Depression, Furuncle of right axilla, Gall bladder polyp (3/16), Gallbladder polyp (March 2016), Hematuria, Hyperlipidemia, Hypertension, Insomnia, Insomnia, Tobacco abuse, and Type 1 diabetes mellitus (HCC).  PSH:    Past Surgical History:  Procedure Laterality Date  . APPENDECTOMY    . CARDIAC CATHETERIZATION    . CESAREAN SECTION    . CESAREAN SECTION     x2  . NASAL SINUS SURGERY    . TONSILECTOMY/ADENOIDECTOMY WITH MYRINGOTOMY      Current Outpatient Medications  Medication Sig Dispense Refill  . BD  PEN NEEDLE NANO U/F 32G X 4 MM MISC See admin instructions. Use as directed.    . hydrOXYzine (ATARAX/VISTARIL) 25 MG tablet Take 1 tablet (25 mg total) by mouth at bedtime as needed. 30 tablet 7  . insulin aspart (NOVOLOG) 100 UNIT/ML injection USE UP TO 130 UNITS PER DAY IN INSULIN PUMP    . Insulin Human (INSULIN PUMP) SOLN Inject into the skin continuous. Use as directed continuously.    . isosorbide mononitrate (IMDUR) 30 MG 24 hr tablet TAKE 1 TABLET BY MOUTH DAILY 90 tablet 1  . ketoconazole (NIZORAL) 2 % shampoo APPLY TO AFFECTED AREAS AND LATHER LET SIT FOR 5 MINUTES THEN RINSE REPEAT ONCE A DAY FOR ONE WEEK THEN USE ONLY ONCE A WEEK 120 mL 1  . naproxen (NAPROSYN) 500 MG tablet Take 1 tablet (500 mg total) by mouth 2 (two) times daily with a meal. 60 tablet 3  . nitrofurantoin, macrocrystal-monohydrate, (MACROBID) 100 MG capsule Take 1 capsule (100 mg total) by mouth 2 (two) times daily. 14 capsule 0  . omeprazole (PRILOSEC) 20 MG capsule Take 1 capsule (20 mg total) by mouth daily. 90 capsule 1  . ondansetron (ZOFRAN ODT) 4 MG disintegrating tablet Take 1 tablet (4 mg total) by mouth every 6 (six) hours as needed for nausea or vomiting. 20 tablet 0  . ONE TOUCH ULTRA TEST test strip 1 each by Other route 4 (four) times daily.     . quinapril (ACCUPRIL) 40  MG tablet Take 1 tablet (40 mg total) by mouth daily. 30 tablet 6  . rosuvastatin (CRESTOR) 10 MG tablet TAKE 1 TABLET BY MOUTH AT BEDTIME FOR CHOLESTEROL 90 tablet 1  . amLODipine (NORVASC) 10 MG tablet Take 1 tablet (10 mg total) by mouth daily. 30 tablet 0  . metoprolol succinate (TOPROL XL) 25 MG 24 hr tablet Take 1.5 tablets (37.5 mg total) by mouth daily. 45 tablet 0   No current facility-administered medications for this visit.      Allergies:   Morphine and related; Sulfa antibiotics; Tramadol; Vicodin [hydrocodone-acetaminophen]; and Septra [sulfamethoxazole-trimethoprim]   Social History:  The patient  reports that she has  been smoking cigarettes.  She has been smoking about 0.50 packs per day. She has never used smokeless tobacco. She reports that she does not drink alcohol or use drugs.   Family History:   family history includes Addison's disease in her mother; Allergies in her daughter; Asthma in her daughter; Breast cancer in her maternal aunt; Breast cancer (age of onset: 58) in her sister; Breast cancer (age of onset: 66) in her mother; Cancer in her mother and sister; Dementia in her mother; Diabetes in her mother; Heart disease in her father and mother; Stroke in her mother.    Review of Systems: Review of Systems  Constitutional: Negative.   Respiratory: Negative.   Cardiovascular: Negative.   Gastrointestinal: Negative.   Musculoskeletal: Negative.   Neurological: Negative.   Psychiatric/Behavioral: The patient does not have insomnia.   All other systems reviewed and are negative.    PHYSICAL EXAM: VS:  BP 136/76 (BP Location: Left Arm, Patient Position: Sitting, Cuff Size: Normal)   Pulse 70   Ht 5\' 8"  (1.727 m)   Wt 249 lb (112.9 kg)   BMI 37.86 kg/m  , BMI Body mass index is 37.86 kg/m. GEN: Well nourished, well developed, in no acute distress  HEENT: normal  Neck: no JVD, carotid bruits, or masses Cardiac: RRR; no murmurs, rubs, or gallops,no edema  Respiratory:  clear to auscultation bilaterally, normal work of breathing GI: soft, nontender, nondistended, + BS MS: no deformity or atrophy  Skin: warm and dry, no rash Neuro:  Strength and sensation are intact Psych: euthymic mood, full affect    Recent Labs: 10/06/2016: ALT 14 03/25/2017: BUN 9; Creatinine, Ser 0.70; Hemoglobin 11.9; Platelets 246; Potassium 3.7; Sodium 136    Lipid Panel Lab Results  Component Value Date   CHOL 161 05/02/2016   HDL 51 05/02/2016   LDLCALC 84 05/02/2016   TRIG 128 05/02/2016      Wt Readings from Last 3 Encounters:  07/17/17 249 lb (112.9 kg)  03/25/17 240 lb (108.9 kg)  10/06/16 240  lb (108.9 kg)       ASSESSMENT AND PLAN:  Coronary artery disease of native artery of native heart with stable angina pectoris (HCC) - Plan: EKG 12-Lead Currently with no symptoms of angina. No further workup at this time. Continue current medication regimen.Atypical symptoms Stable  Tobacco abuse We have encouraged her to continue to work on weaning her cigarettes and smoking cessation. She will continue to work on this and does not want any assistance with chantix. More than 10 minutes spent discussing smoking cessation techniques  Mixed hyperlipidemia Cholesterol is at goal on the current lipid regimen. No changes to the medications were made.  Essential hypertension Blood pressure is well controlled on today's visit. No changes made to the medications.  Type 1 diabetes mellitus with  other circulatory complication (HCC) Hemoglobin A1c relatively well-controlled managed by endocrinology Long discussion with her concerning need for last modification, walking program, dietary changes and smoking cessation  Disposition:   F/U  12 months   Total encounter time more than 25 minutes  Greater than 50% was spent in counseling and coordination of care with the patient    Orders Placed This Encounter  Procedures  . EKG 12-Lead     Signed, Dossie Arbourim Alicya Bena, M.D., Ph.D. 07/17/2017  [facility], [place] [phone]

## 2017-07-17 ENCOUNTER — Encounter: Payer: Self-pay | Admitting: Cardiovascular Disease

## 2017-07-17 ENCOUNTER — Encounter

## 2017-07-17 ENCOUNTER — Ambulatory Visit: Payer: BLUE CROSS/BLUE SHIELD | Admitting: Cardiovascular Disease

## 2017-07-17 VITALS — BP 136/76 | HR 70 | Ht 68.0 in | Wt 249.0 lb

## 2017-07-17 DIAGNOSIS — E1059 Type 1 diabetes mellitus with other circulatory complications: Secondary | ICD-10-CM

## 2017-07-17 DIAGNOSIS — Z720 Tobacco use: Secondary | ICD-10-CM

## 2017-07-17 DIAGNOSIS — I25118 Atherosclerotic heart disease of native coronary artery with other forms of angina pectoris: Secondary | ICD-10-CM | POA: Diagnosis not present

## 2017-07-17 DIAGNOSIS — I10 Essential (primary) hypertension: Secondary | ICD-10-CM

## 2017-07-17 DIAGNOSIS — E782 Mixed hyperlipidemia: Secondary | ICD-10-CM

## 2017-07-17 NOTE — Patient Instructions (Signed)

## 2019-12-26 ENCOUNTER — Telehealth: Payer: Self-pay | Admitting: Cardiovascular Disease

## 2019-12-26 NOTE — Telephone Encounter (Signed)
Patient moved declined recall.
